# Patient Record
Sex: Female | Born: 1965
Health system: Southern US, Community
[De-identification: ages and names within clinical notes are randomized; demographics above are authoritative.]

## PROBLEM LIST (undated history)

## (undated) DIAGNOSIS — G4700 Insomnia, unspecified: Secondary | ICD-10-CM

## (undated) DIAGNOSIS — R519 Headache, unspecified: Secondary | ICD-10-CM

## (undated) DIAGNOSIS — I10 Essential (primary) hypertension: Secondary | ICD-10-CM

## (undated) DIAGNOSIS — F419 Anxiety disorder, unspecified: Secondary | ICD-10-CM

## (undated) DIAGNOSIS — J069 Acute upper respiratory infection, unspecified: Secondary | ICD-10-CM

## (undated) DIAGNOSIS — K8020 Calculus of gallbladder without cholecystitis without obstruction: Secondary | ICD-10-CM

## (undated) DIAGNOSIS — F909 Attention-deficit hyperactivity disorder, unspecified type: Secondary | ICD-10-CM

## (undated) DIAGNOSIS — T7840XA Allergy, unspecified, initial encounter: Secondary | ICD-10-CM

## (undated) DIAGNOSIS — F32A Depression, unspecified: Secondary | ICD-10-CM

## (undated) DIAGNOSIS — G43909 Migraine, unspecified, not intractable, without status migrainosus: Secondary | ICD-10-CM

## (undated) HISTORY — DX: Calculus of gallbladder without cholecystitis without obstruction: K80.20

## (undated) HISTORY — PX: COLONOSCOPY: SHX174

## (undated) HISTORY — DX: Insomnia, unspecified: G47.00

## (undated) HISTORY — PX: GREAT TOE ARTHRODESIS, METATARSALPHALANGEAL JOINT: SUR56

## (undated) HISTORY — PX: EYE SURGERY: SHX253

## (undated) HISTORY — DX: Allergy, unspecified, initial encounter: T78.40XA

## (undated) HISTORY — DX: Acute upper respiratory infection, unspecified: J06.9

## (undated) HISTORY — DX: Migraine, unspecified, not intractable, without status migrainosus: G43.909

---

## 1998-03-21 ENCOUNTER — Inpatient Hospital Stay (HOSPITAL_COMMUNITY): Admission: AD | Admit: 1998-03-21 | Discharge: 1998-03-21 | Payer: Self-pay | Admitting: Obstetrics and Gynecology

## 1998-04-11 ENCOUNTER — Inpatient Hospital Stay (HOSPITAL_COMMUNITY): Admission: AD | Admit: 1998-04-11 | Discharge: 1998-04-13 | Payer: Self-pay | Admitting: Obstetrics and Gynecology

## 1998-05-27 ENCOUNTER — Inpatient Hospital Stay (HOSPITAL_COMMUNITY): Admission: AD | Admit: 1998-05-27 | Discharge: 1998-05-27 | Payer: Self-pay | Admitting: Obstetrics and Gynecology

## 1998-06-08 ENCOUNTER — Inpatient Hospital Stay (HOSPITAL_COMMUNITY): Admission: AD | Admit: 1998-06-08 | Discharge: 1998-06-08 | Payer: Self-pay | Admitting: *Deleted

## 1998-06-09 ENCOUNTER — Inpatient Hospital Stay (HOSPITAL_COMMUNITY): Admission: AD | Admit: 1998-06-09 | Discharge: 1998-06-09 | Payer: Self-pay | Admitting: Obstetrics and Gynecology

## 1998-06-13 ENCOUNTER — Inpatient Hospital Stay (HOSPITAL_COMMUNITY): Admission: AD | Admit: 1998-06-13 | Discharge: 1998-06-15 | Payer: Self-pay | Admitting: Obstetrics and Gynecology

## 1998-07-27 ENCOUNTER — Other Ambulatory Visit: Admission: RE | Admit: 1998-07-27 | Discharge: 1998-07-27 | Payer: Self-pay | Admitting: Obstetrics and Gynecology

## 1999-08-15 ENCOUNTER — Other Ambulatory Visit: Admission: RE | Admit: 1999-08-15 | Discharge: 1999-08-15 | Payer: Self-pay | Admitting: Obstetrics and Gynecology

## 2000-08-18 ENCOUNTER — Other Ambulatory Visit: Admission: RE | Admit: 2000-08-18 | Discharge: 2000-08-18 | Payer: Self-pay | Admitting: Obstetrics and Gynecology

## 2001-04-30 ENCOUNTER — Encounter: Payer: Self-pay | Admitting: Family Medicine

## 2001-04-30 ENCOUNTER — Encounter: Admission: RE | Admit: 2001-04-30 | Discharge: 2001-04-30 | Payer: Self-pay | Admitting: Family Medicine

## 2001-09-16 ENCOUNTER — Other Ambulatory Visit: Admission: RE | Admit: 2001-09-16 | Discharge: 2001-09-16 | Payer: Self-pay | Admitting: Obstetrics and Gynecology

## 2002-09-26 ENCOUNTER — Other Ambulatory Visit: Admission: RE | Admit: 2002-09-26 | Discharge: 2002-09-26 | Payer: Self-pay | Admitting: Obstetrics and Gynecology

## 2003-03-29 ENCOUNTER — Ambulatory Visit (HOSPITAL_BASED_OUTPATIENT_CLINIC_OR_DEPARTMENT_OTHER): Admission: RE | Admit: 2003-03-29 | Discharge: 2003-03-29 | Payer: Self-pay | Admitting: Obstetrics and Gynecology

## 2003-10-06 ENCOUNTER — Other Ambulatory Visit: Admission: RE | Admit: 2003-10-06 | Discharge: 2003-10-06 | Payer: Self-pay | Admitting: Obstetrics and Gynecology

## 2004-11-07 ENCOUNTER — Other Ambulatory Visit: Admission: RE | Admit: 2004-11-07 | Discharge: 2004-11-07 | Payer: Self-pay | Admitting: Obstetrics and Gynecology

## 2005-04-07 ENCOUNTER — Other Ambulatory Visit: Admission: RE | Admit: 2005-04-07 | Discharge: 2005-04-07 | Payer: Self-pay | Admitting: Obstetrics and Gynecology

## 2005-08-04 ENCOUNTER — Other Ambulatory Visit: Admission: RE | Admit: 2005-08-04 | Discharge: 2005-08-04 | Payer: Self-pay | Admitting: Obstetrics and Gynecology

## 2006-01-26 ENCOUNTER — Other Ambulatory Visit: Admission: RE | Admit: 2006-01-26 | Discharge: 2006-01-26 | Payer: Self-pay | Admitting: Obstetrics and Gynecology

## 2006-11-02 ENCOUNTER — Encounter: Admission: RE | Admit: 2006-11-02 | Discharge: 2006-11-02 | Payer: Self-pay | Admitting: Obstetrics and Gynecology

## 2008-02-11 ENCOUNTER — Encounter: Admission: RE | Admit: 2008-02-11 | Discharge: 2008-02-11 | Payer: Self-pay | Admitting: Obstetrics and Gynecology

## 2009-02-13 ENCOUNTER — Encounter: Admission: RE | Admit: 2009-02-13 | Discharge: 2009-02-13 | Payer: Self-pay | Admitting: Obstetrics and Gynecology

## 2010-05-13 ENCOUNTER — Encounter: Admission: RE | Admit: 2010-05-13 | Discharge: 2010-05-13 | Payer: Self-pay | Admitting: Family Medicine

## 2011-09-01 ENCOUNTER — Ambulatory Visit: Payer: No Typology Code available for payment source | Attending: Family Medicine

## 2011-09-01 DIAGNOSIS — R5381 Other malaise: Secondary | ICD-10-CM | POA: Insufficient documentation

## 2011-09-01 DIAGNOSIS — IMO0001 Reserved for inherently not codable concepts without codable children: Secondary | ICD-10-CM | POA: Insufficient documentation

## 2011-09-01 DIAGNOSIS — M25659 Stiffness of unspecified hip, not elsewhere classified: Secondary | ICD-10-CM | POA: Insufficient documentation

## 2011-09-01 DIAGNOSIS — M545 Low back pain, unspecified: Secondary | ICD-10-CM | POA: Insufficient documentation

## 2011-09-03 ENCOUNTER — Ambulatory Visit: Payer: No Typology Code available for payment source

## 2011-09-08 ENCOUNTER — Ambulatory Visit: Payer: No Typology Code available for payment source | Admitting: Physical Therapy

## 2011-09-09 ENCOUNTER — Encounter (HOSPITAL_BASED_OUTPATIENT_CLINIC_OR_DEPARTMENT_OTHER)
Admission: RE | Admit: 2011-09-09 | Discharge: 2011-09-09 | Disposition: A | Discharge status: Home / Self Care | Payer: BC Managed Care – PPO | Source: Ambulatory Visit | Attending: Orthopedic Surgery | Admitting: Orthopedic Surgery

## 2011-09-09 LAB — BASIC METABOLIC PANEL
BUN: 12 mg/dL (ref 6–23)
Creatinine, Ser: 0.62 mg/dL (ref 0.50–1.10)
GFR calc Af Amer: >60 mL/min (ref >60)
GFR calc non Af Amer: >60 mL/min (ref >60)

## 2011-09-10 ENCOUNTER — Ambulatory Visit: Payer: No Typology Code available for payment source | Admitting: Physical Therapy

## 2011-09-11 ENCOUNTER — Ambulatory Visit (HOSPITAL_BASED_OUTPATIENT_CLINIC_OR_DEPARTMENT_OTHER)
Admission: RE | Admit: 2011-09-11 | Discharge: 2011-09-11 | Disposition: A | Discharge status: Home / Self Care | Payer: BC Managed Care – PPO | Source: Ambulatory Visit | Attending: Orthopedic Surgery | Admitting: Orthopedic Surgery

## 2011-09-11 DIAGNOSIS — Z01810 Encounter for preprocedural cardiovascular examination: Secondary | ICD-10-CM | POA: Insufficient documentation

## 2011-09-11 DIAGNOSIS — K219 Gastro-esophageal reflux disease without esophagitis: Secondary | ICD-10-CM | POA: Insufficient documentation

## 2011-09-11 DIAGNOSIS — I10 Essential (primary) hypertension: Secondary | ICD-10-CM | POA: Insufficient documentation

## 2011-09-11 DIAGNOSIS — M2020 Hallux rigidus, unspecified foot: Secondary | ICD-10-CM | POA: Insufficient documentation

## 2011-09-11 DIAGNOSIS — Z01812 Encounter for preprocedural laboratory examination: Secondary | ICD-10-CM | POA: Insufficient documentation

## 2011-09-11 LAB — POCT HEMOGLOBIN-HEMACUE: Hemoglobin: 15.3 g/dL — ABNORMAL HIGH (ref 12.0–15.0)

## 2011-09-18 NOTE — Op Note (Addendum)
NAMEJAMILYNN, WHITACRE NO.:  [id number]  MEDICAL RECORD NO.:  [id number]  LOCATION:  [facility]                         FACILITY:  [facility]  PHYSICIAN:  Toni Arthurs, MD        DATE OF BIRTH:  07-04-1966  DATE OF PROCEDURE:  09/11/2011 DATE OF DISCHARGE:                              OPERATIVE REPORT   PREOPERATIVE DIAGNOSIS:  Left hallux rigidus.  POSTOPERATIVE DIAGNOSIS:  Left hallux rigidus.  PROCEDURE.:  Left hallux metatarsophalangeal joint cheilectomy.  SURGEON:  Toni Arthurs, MD  ANESTHESIA:  General, regional.  IV FLUIDS:  See anesthesia record.  ESTIMATED BLOOD LOSS:  Minimal.  TOURNIQUET TIME:  See anesthesia record.  COMPLICATIONS:  None apparent.  DISPOSITION:  Extubated, awake, and stable to recovery.  INDICATIONS FOR PROCEDURE:  The patient is a 45 year old woman who complains of left dorsal pain over the first MTP joint.  She has x-ray findings consistent with hallux rigidus.  She has failed nonoperative treatment including activity modification, shoe modification, and oral anti-inflammatory medications.  She presents now for operative treatment of this condition.  She understands the risks and benefits of this procedure as well as the alternative treatment options.  Specifically, she understands risks of bleeding, infection, nerve damage, blood clots, need for additional surgery, amputation and death.  She elects to proceed.  PROCEDURE IN DETAIL:  After preoperative consent was obtained, the correct operative site was identified.  The patient was brought to the operating room and placed supine on the operating table.  General anesthesia was induced.  Preoperative antibiotics were administered. Surgical time-out was taken.  The left lower extremity was then prepped and draped in standard sterile fashion.  A longitudinal incision was marked over the dorsal aspect of the first MTP joint.  The extremity was exsanguinated with a fourth inch  Esmarch bandage, and this was wrapped around the ankle as a tourniquet.  The dorsal incision was made and sharp dissection was carried through the skin and subcutaneous tissue to the level of the extensor hallucis longus tendon.  The dorsal joint capsule was incised at the EHL tendon taking care to protect the tendon. It was retracted laterally exposing the EHB tendon.  Again, the dorsal capsule was incised adjacent to the [facility] tendon and this was retracted laterally as well, significant amount of synovitis was noted immediately upon opening the joint.  This was all excised sharply leaving the capsule itself intact.  Two small loose bodies were removed.  The base of the proximal phalanx was noted to have loss of cartilage at its dorsal few millimeters.  A 0.054 K-wire was used to perforate the subchondral bone in this area.  The sagittal saw was then used to resect the large dorsal osteophyte off the metatarsal head.  The cut edges were smoothed with a rongeur.  The hallux MP joint was noted to dorsiflex approximately 90 degrees and plantar flex 75 degrees at this point.  The wound was irrigated copiously down into the MTP joint.  The dorsal joint capsule was repaired over the cut surface of the bone.  The extensor hallucis longus was noted to move freely over the repaired dorsal capsule.  The subcutaneous  tissue was then approximated with inverted simple sutures of 4-0 Monocryl.  The skin was closed with horizontal mattress sutures of 3-0 Prolene.  Sterile dressings were applied followed by a compression wrap.  The patient was then awakened by anesthesia and transported to recovery room in stable condition. Tourniquet had been released after application of the dressings.  FOLLOW-UP PLAN:  The patient will be weightbearing as tolerated on her left lower extremity in hard sole shoe.  She is encouraged to move it to her tolerance.  She will follow up with me in 2 weeks for  suture removal.     Toni Arthurs, MD     JH/MEDQ  D:  09/11/2011  T:  09/11/2011  Job:  [id number]  Electronically Signed by Jonny Ruiz Marranda Arakelian  on 09/24/2011 12:32:06 PM

## 2011-09-22 ENCOUNTER — Encounter: Payer: No Typology Code available for payment source | Admitting: Physical Therapy

## 2011-09-25 ENCOUNTER — Ambulatory Visit: Payer: No Typology Code available for payment source | Attending: Family Medicine

## 2011-09-25 DIAGNOSIS — M545 Low back pain, unspecified: Secondary | ICD-10-CM | POA: Insufficient documentation

## 2011-09-25 DIAGNOSIS — IMO0001 Reserved for inherently not codable concepts without codable children: Secondary | ICD-10-CM | POA: Insufficient documentation

## 2011-09-25 DIAGNOSIS — R5381 Other malaise: Secondary | ICD-10-CM | POA: Insufficient documentation

## 2011-09-25 DIAGNOSIS — M25659 Stiffness of unspecified hip, not elsewhere classified: Secondary | ICD-10-CM | POA: Insufficient documentation

## 2011-10-06 ENCOUNTER — Ambulatory Visit: Payer: No Typology Code available for payment source

## 2013-10-12 ENCOUNTER — Other Ambulatory Visit: Payer: Self-pay | Admitting: Family Medicine

## 2013-10-12 ENCOUNTER — Ambulatory Visit
Admission: RE | Admit: 2013-10-12 | Discharge: 2013-10-12 | Disposition: A | Discharge status: Home / Self Care | Payer: BC Managed Care – PPO | Source: Ambulatory Visit | Attending: Family Medicine | Admitting: Family Medicine

## 2013-10-12 DIAGNOSIS — M5431 Sciatica, right side: Secondary | ICD-10-CM

## 2014-01-31 ENCOUNTER — Encounter: Payer: Self-pay | Admitting: Family Medicine

## 2014-01-31 ENCOUNTER — Ambulatory Visit (INDEPENDENT_AMBULATORY_CARE_PROVIDER_SITE_OTHER): Payer: BC Managed Care – PPO | Admitting: Family Medicine

## 2014-01-31 VITALS — BP 110/62 | HR 72 | Temp 97.7°F | Resp 16 | Ht <= 58 in | Wt 123.0 lb

## 2014-01-31 DIAGNOSIS — M999 Biomechanical lesion, unspecified: Secondary | ICD-10-CM

## 2014-01-31 DIAGNOSIS — M9981 Other biomechanical lesions of cervical region: Secondary | ICD-10-CM

## 2014-01-31 DIAGNOSIS — M62838 Other muscle spasm: Secondary | ICD-10-CM

## 2014-01-31 MED ORDER — MELOXICAM 15 MG PO TABS: 15.0000 mg | ORAL_TABLET | Freq: Every day | ORAL | Status: DC

## 2014-01-31 NOTE — Progress Notes (Signed)
  Tawana ScaleZach Smith D.O. [facility] [address] [place], [place] [address] Phone: [phone] Subjective:     CC: neck spasm.   QQV:ZDGLOVFIEPHPI:Subjective Sheila AlertWendi Lou Salazar is a 48 y.o. female coming in with complaint of Left-sided neck spasm. Patient states that she's had this intermittent but unfortunately this week and she had a very bad one that continued throughout this first 2 days of week. Patient is having trouble rotating to the left. Patient states it is localized on the left side with no radiation down the arm or any numbness or weakness. Patient states that he was very severe at 9/10 but now down to 4/10. Patient states that it even hurts while she is sleeping. Patient has tried over-the-counter anti-inflammatories with mild improvement. Patient was told by a friend that osteopathic manipulation may be helpful.     Past medical history, social, surgical and family history all reviewed in electronic medical record.   Review of Systems: No headache, visual changes, nausea, vomiting, diarrhea, constipation, dizziness, abdominal pain, skin rash, fevers, chills, night sweats, weight loss, swollen lymph nodes, body aches, joint swelling, muscle aches, chest pain, shortness of breath, mood changes.   Objective Blood pressure 110/62, pulse 72, temperature 97.7 F (36.5 C), temperature source Oral, resp. rate 16, weight 123 lb (55.792 kg), SpO2 98.00%.  General: No apparent distress Salazar and oriented x3 mood and affect normal, dressed appropriately.  HEENT: Pupils equal, extraocular movements intact  Respiratory: Patient's speak in full sentences and does not appear short of breath  Cardiovascular: No lower extremity edema, non tender, no erythema  Skin: Warm dry intact with no signs of infection or rash on extremities or on axial skeleton.  Abdomen: Soft nontender  Neuro: Cranial nerves II through XII are intact, neurovascularly intact in all extremities with 2+ DTRs and 2+ pulses.  Lymph:  No lymphadenopathy of posterior or anterior cervical chain or axillae bilaterally.  Gait normal with good balance and coordination.  MSK:  Non tender with full range of motion and good stability and symmetric strength and tone of shoulders, elbows, wrist, hip, knee and ankles bilaterally.  Neck: Inspection unremarkable. No palpable stepoffs. Negative Spurling's maneuver. Patient does have limited left rotation and left side bending otherwise full range of motion. Grip strength and sensation normal in bilateral hands Strength good C4 to T1 distribution No sensory change to C4 to T1 Negative Hoffman sign bilaterally Reflexes normal  OMT Physical Exam  Standing structural       Occiput left higher  Shoulder left higher.     Standing flexion   Seated Flexion  Cervical  C2 flexed rotated and side bent left C4 flexed rotated inside that right  Thoracic T3 extended rotated and side bent left  Lumbar L2 flexed rotated inside the right  Sacrum Neutral Illium       Impression and Recommendations:     This case required medical decision making of moderate complexity.

## 2014-01-31 NOTE — Assessment & Plan Note (Signed)

## 2014-01-31 NOTE — Progress Notes (Signed)
Pre-visit discussion using our clinic review tool. No additional management support is needed unless otherwise documented below in the visit note.  

## 2014-01-31 NOTE — Assessment & Plan Note (Signed)
Patient responded very well to osteopathic manipulation. Medications per orders Home exercise program given and discussed icing protocol Patient will also try over-the-counter medicines that could be beneficial. Patient will come back again in 2-3 weeks for further manipulation.

## 2014-01-31 NOTE — Assessment & Plan Note (Signed)
Decision today to treat with OMT was based on Physical Exam  After verbal consent patient was treated with HVLA, ME, FPR techniques in cervical, thoracic, lumbar areas  Patient tolerated the procedure well with improvement in symptoms  Patient given exercises, stretches and lifestyle modifications  See medications in patient instructions if given  Patient will follow up in 2-3 weeks 

## 2014-01-31 NOTE — Patient Instructions (Signed)
Good to meet you Ice for first 48 hours then heat if have a spasm.  For your neck. Try exercises 3 times a week for next 2 weeks  Posture exercises. Stand on wall with heels, butt, shoulders and head daily for goal of 5 minutes.  Try to strengthen hip abductors on side, lift 6 inches, hold 2 second, down slow for count of 4 repeat 30 reps each side daily.  Vitamin D 2000 IU daily  Turmeric 500mg  twice daily.  Come back again in 2 weeks.

## 2014-02-14 ENCOUNTER — Ambulatory Visit: Payer: BC Managed Care – PPO | Admitting: Family Medicine

## 2014-02-22 ENCOUNTER — Ambulatory Visit (INDEPENDENT_AMBULATORY_CARE_PROVIDER_SITE_OTHER): Payer: BC Managed Care – PPO | Admitting: Family Medicine

## 2014-02-22 ENCOUNTER — Encounter: Payer: Self-pay | Admitting: Family Medicine

## 2014-02-22 VITALS — BP 110/62 | HR 87 | Temp 98.0°F | Resp 16 | Wt 120.2 lb

## 2014-02-22 DIAGNOSIS — M62838 Other muscle spasm: Secondary | ICD-10-CM

## 2014-02-22 DIAGNOSIS — M6240 Contracture of muscle, unspecified site: Secondary | ICD-10-CM

## 2014-02-22 DIAGNOSIS — M9981 Other biomechanical lesions of cervical region: Secondary | ICD-10-CM

## 2014-02-22 DIAGNOSIS — M24559 Contracture, unspecified hip: Secondary | ICD-10-CM | POA: Insufficient documentation

## 2014-02-22 DIAGNOSIS — M999 Biomechanical lesion, unspecified: Secondary | ICD-10-CM

## 2014-02-22 NOTE — Assessment & Plan Note (Signed)
Decision today to treat with OMT was based on Physical Exam  After verbal consent patient was treated with HVLA, ME, FPR techniques in cervical, thoracic, lumbar  areas  Patient tolerated the procedure well with improvement in symptoms  Patient given exercises, stretches and lifestyle modifications  See medications in patient instructions if given  Patient will follow up in 4 weeks 

## 2014-02-22 NOTE — Assessment & Plan Note (Signed)
Decision today to treat with OMT was based on Physical Exam  After verbal consent patient was treated with HVLA, ME, FPR techniques in cervical, thoracic, lumbar areas  Patient tolerated the procedure well with improvement in symptoms  Patient given exercises, stretches and lifestyle modifications  See medications in patient instructions if given  Patient will follow up in 4 weeksDecision today to treat with OMT was based on Physical Exam  After verbal consent patient was treated with HVLA, ME, FPR techniques in cervical, thoracic, lumbar areas  Patient tolerated the procedure well with improvement in symptoms  Patient given exercises, stretches and lifestyle modifications  See medications in patient instructions if given  Patient will follow up in 4 weeks

## 2014-02-22 NOTE — Patient Instructions (Signed)
Good to see you! You are doing great.  Try exercises now 3 times a week Hip flexor stretch I am adding.  Do home exercise for your TMJ down then over.  Come back again 4 weeks.

## 2014-02-22 NOTE — Progress Notes (Signed)
Pre visit review using our clinic review tool, if applicable. No additional management support is needed unless otherwise documented below in the visit note. 

## 2014-02-22 NOTE — Assessment & Plan Note (Signed)
This is likely secondary to patient's hip abductor weakness. Patient is strengthening but still somewhat weak. Patient given a home exercises working on stretching the area. Patient will come back again in 4 weeks for further evaluation.

## 2014-02-22 NOTE — Assessment & Plan Note (Signed)
Patient continues to do relatively well. Encourage her to continue the posture exercises at least 3 times a week. Given some new exercises working on hip flexors well. Patient and will come back again in 4 weeks for further evaluation.

## 2014-02-22 NOTE — Progress Notes (Signed)
  Sheila Salazar D.O.  [facility] [address] [place], [place] [address] Phone: [phone] Subjective:     CC: neck spasm. Followup  FAO:ZHYQMVHQIOHPI:Subjective Sheila DupesWendi Salazar is a 48 y.o. female coming in with complaint of Left-sided neck spasm. Patient was seen previously and has been working on posture as well as back exercises. Patient did respond very well to osteopathic manipulation. Patient states she has approximately 40-60% better. Patient still has some mild discomfort over the course last week of low back pain but otherwise her neck has been feeling much better.    Past medical history, social, surgical and family history all reviewed in electronic medical record.   Review of Systems: No headache, visual changes, nausea, vomiting, diarrhea, constipation, dizziness, abdominal pain, skin rash, fevers, chills, night sweats, weight loss, swollen lymph nodes, body aches, joint swelling, muscle aches, chest pain, shortness of breath, mood changes.   Objective Blood pressure 110/62, pulse 87, temperature 98 F (36.7 C), temperature source Oral, resp. rate 16, weight 120 lb 3.2 oz (54.522 kg), SpO2 98.00%.  General: No apparent distress alert and oriented x3 mood and affect normal, dressed appropriately.  HEENT: Pupils equal, extraocular movements intact  Respiratory: Patient's speak in full sentences and does not appear short of breath  Cardiovascular: No lower extremity edema, non tender, no erythema  Skin: Warm dry intact with no signs of infection or rash on extremities or on axial skeleton.  Abdomen: Soft nontender  Neuro: Cranial nerves II through XII are intact, neurovascularly intact in all extremities with 2+ DTRs and 2+ pulses.  Lymph: No lymphadenopathy of posterior or anterior cervical chain or axillae bilaterally.  Gait normal with good balance and coordination.  MSK:  Non tender with full range of motion and good stability and symmetric strength and tone of shoulders,  elbows, wrist, hip, knee and ankles bilaterally.  Neck: Inspection unremarkable. No palpable stepoffs. Negative Spurling's maneuver. Patient does have limited left rotation and left side bending otherwise full range of motion. Grip strength and sensation normal in bilateral hands Strength good C4 to T1 distribution No sensory change to C4 to T1 Negative Hoffman sign bilaterally Reflexes normal  OMT Physical Exam  Standing structural    Cervical  C2 flexed rotated and side bent left C4 flexed rotated inside that right  Thoracic T3 extended rotated and side bent left  Lumbar L2 flexed rotated inside the right  Sacrum Right on right Illium       Impression and Recommendations:     This case required medical decision making of moderate complexity.

## 2014-03-31 ENCOUNTER — Ambulatory Visit: Payer: BC Managed Care – PPO | Admitting: Family Medicine

## 2014-04-04 ENCOUNTER — Encounter: Payer: Self-pay | Admitting: Family Medicine

## 2014-04-04 ENCOUNTER — Ambulatory Visit (INDEPENDENT_AMBULATORY_CARE_PROVIDER_SITE_OTHER): Payer: BC Managed Care – PPO | Admitting: Family Medicine

## 2014-04-04 VITALS — BP 110/72 | HR 83

## 2014-04-04 DIAGNOSIS — R1031 Right lower quadrant pain: Secondary | ICD-10-CM

## 2014-04-04 DIAGNOSIS — G8929 Other chronic pain: Secondary | ICD-10-CM

## 2014-04-04 NOTE — Assessment & Plan Note (Signed)
Patient does have some right lower quadrant pain. Patient does have involuntary guarding of this right lower quadrant. Patient though does seem to be fairly comfortable for any potential appendicitis. Discussed with patient at this time about different possible treatment options but would patient having decreased appetite as well as she stating that the pain seems to be worsening while we discussed we did decide to do further imaging studies including a CT scan of the abdomen. I would like to rule out an appendicitis. Patient also has a past medical history significant for ovarian cysts. Patient does have intrauterine device and states that there is no chance of being pregnant. Patient will get this test depending on what we find and we can discuss further detail about treatment options. It is an appendicitis patient will likely be having surgery short of an ovarian cyst we will monitor closely. The patient is having referred pain from her back and we'll consider further imaging including x-ray of potential MRI but I do not think that this is the case with a negative straight leg test. We did not do osteopathic manipulation today and she will discuss again in followup in 1-2 weeks.  Spent greater than 25 minutes with patient face-to-face and had greater than 50% of counseling including as described above in assessment and plan.

## 2014-04-04 NOTE — Patient Instructions (Signed)
You are doing well.  CT scan ordered now. I will call you with the results.  Fever or chills, or nausea, go to ED immediately.  Diet watch no nuts popcorn for now.  Come back in 2 weeks.

## 2014-04-04 NOTE — Progress Notes (Signed)
  Tawana ScaleZach Jashan Cotten D.O. [facility] [address] [place], [place] [address] Phone: [phone] Subjective:     CC: neck pain and low back pain.   BJY:NWGNFAOZHYHPI:Subjective Sheila AlertWendi Lou MinerHuffman is a 48 y.o. female coming in with more complaint of low back pain. Patient has had tight hip flexor for but states that it seems to be isolated mostly on the lower right side. Patient states it seems to radiate anteriorly. Patient denies any rash any fever but states that she is simply lost her appetite of recently. Patient states the pain has been intermittent but has started to increase in frequency and duration over the course last couple months. Patient states that she can have some mild radiation of his be going down anterior aspect of her thigh as well. Patient denies any significant bowel changes or any significant weight loss.  Past medical history, social, surgical and family history all reviewed in electronic medical record.   Review of Systems: No headache, visual changes, nausea, vomiting, diarrhea, constipation, dizziness, abdominal pain, skin rash, fevers, chills, night sweats, weight loss, swollen lymph nodes, body aches, joint swelling, muscle aches, chest pain, shortness of breath, mood changes.   Objective Blood pressure 110/72, pulse 83, SpO2 98.00%.  General: No apparent distress Salazar and oriented x3 mood and affect normal, dressed appropriately.  HEENT: Pupils equal, extraocular movements intact  Respiratory: Patient's speak in full sentences and does not appear short of breath  Cardiovascular: No lower extremity edema, non tender, no erythema  Skin: Warm dry intact with no signs of infection or rash on extremities or on axial skeleton.  Abdomen: Soft the patient is tender to palpation in the right lower quadrant. Patient does have involuntary guarding over McBurney's point but does not have any rebound tenderness. Neuro: Cranial nerves II through XII are intact, neurovascularly intact in all  extremities with 2+ DTRs and 2+ pulses.  Lymph: No lymphadenopathy of posterior or anterior cervical chain or axillae bilaterally.  Gait normal with good balance and coordination.  MSK:  Non tender with full range of motion and good stability and symmetric strength and tone of shoulders, elbows, wrist, hip, knee and ankles bilaterally.  Back Exam:  Inspection: Unremarkable  Motion: Flexion 35 deg, Extension 35 deg, Side Bending to 35 deg bilaterally,  Rotation to 35 deg bilaterally  SLR laying: Negative  XSLR laying: Negative  Palpable tenderness: over L4 on right and right SI joint. FABER: negative. Sensory change: Gross sensation intact to all lumbar and sacral dermatomes.  Reflexes: 2+ at both patellar tendons, 2+ at achilles tendons, Babinski's downgoing.  Strength at foot  Plantar-flexion: 5/5 Dorsi-flexion: 5/5 Eversion: 5/5 Inversion: 5/5  Leg strength  Quad: 5/5 Hamstring: 5/5 Hip flexor: 5/5 Hip abductors: 5/5  Gait unremarkable.       Impression and Recommendations:     This case required medical decision making of moderate complexity.

## 2014-04-05 ENCOUNTER — Ambulatory Visit (INDEPENDENT_AMBULATORY_CARE_PROVIDER_SITE_OTHER)
Admission: RE | Admit: 2014-04-05 | Discharge: 2014-04-05 | Disposition: A | Discharge status: Home / Self Care | Payer: BC Managed Care – PPO | Source: Ambulatory Visit | Attending: Family Medicine | Admitting: Family Medicine

## 2014-04-05 DIAGNOSIS — R1031 Right lower quadrant pain: Secondary | ICD-10-CM

## 2014-04-05 MED ORDER — IOHEXOL 300 MG/ML  SOLN
100.0000 mL | Freq: Once | INTRAMUSCULAR | Status: AC | PRN
  Administered 2014-04-05: 100 mL via INTRAVENOUS

## 2014-05-22 ENCOUNTER — Encounter: Payer: Self-pay | Admitting: Family Medicine

## 2014-05-22 ENCOUNTER — Ambulatory Visit (INDEPENDENT_AMBULATORY_CARE_PROVIDER_SITE_OTHER): Payer: BC Managed Care – PPO | Admitting: Family Medicine

## 2014-05-22 VITALS — BP 120/72 | HR 78 | Ht 60.0 in | Wt 119.0 lb

## 2014-05-22 DIAGNOSIS — M6240 Contracture of muscle, unspecified site: Secondary | ICD-10-CM

## 2014-05-22 DIAGNOSIS — M999 Biomechanical lesion, unspecified: Secondary | ICD-10-CM

## 2014-05-22 DIAGNOSIS — M9981 Other biomechanical lesions of cervical region: Secondary | ICD-10-CM

## 2014-05-22 DIAGNOSIS — M24559 Contracture, unspecified hip: Secondary | ICD-10-CM

## 2014-05-22 NOTE — Assessment & Plan Note (Signed)
Decision today to treat with OMT was based on Physical Exam  After verbal consent patient was treated with HVLA and ME techniques in cervical, thoracic, lumbar and sacral areas  Patient tolerated the procedure well with improvement in symptoms  Patient given exercises, stretches and lifestyle modifications  See medications in patient instructions if given  Patient will follow up in 3-4 weeks

## 2014-05-22 NOTE — Progress Notes (Signed)
  [name] [address] [place], [place] [address] Phone: [phone] Subjective:     CC: neck pain and low back pain.   VQM:[id number] Sheila Salazar is a 48 y.o. female coming in with more complaint of low back pain.  Patient has been seen previously and will has responded well to osteopathic manipulation in the past. Patient has not been doing the exercises on a regular basis. Patient knows that when she does the exercises she usually does well when she does not do them on a regular basis she has more pain. Patient states though that overall she is still able to do all activities daily living. Patient has had an increasing amount of stress her recently.   Past medical history, social, surgical and family history all reviewed in electronic medical record.   Review of Systems: No headache, visual changes, nausea, vomiting, diarrhea, constipation, dizziness, abdominal pain, skin rash, fevers, chills, night sweats, weight loss, swollen lymph nodes, body aches, joint swelling, muscle aches, chest pain, shortness of breath, mood changes.   Objective Blood pressure 120/72, pulse 78, height 5' (1.524 m), weight 119 lb (53.978 kg), SpO2 98.00%.  General: No apparent distress alert and oriented x3 mood and affect normal, dressed appropriately.  HEENT: Pupils equal, extraocular movements intact  Respiratory: Patient's speak in full sentences and does not appear short of breath  Cardiovascular: No lower extremity edema, non tender, no erythema  Skin: Warm dry intact with no signs of infection or rash on extremities or on axial skeleton.  Abdomen: nontender.  Lymph: No lymphadenopathy of posterior or anterior cervical chain or axillae bilaterally.  Gait normal with good balance and coordination.  MSK:  Non tender with full range of motion and good stability and symmetric strength and tone of shoulders, elbows, wrist, hip, knee and ankles bilaterally.  Back Exam:    Inspection: Unremarkable  Motion: Flexion 35 deg, Extension 35 deg, Side Bending to 35 deg bilaterally,  Rotation to 35 deg bilaterally  SLR laying: Negative  XSLR laying: Negative  Palpable tenderness: over L4 on right and right SI joint. FABER: negative. Sensory change: Gross sensation intact to all lumbar and sacral dermatomes.  Reflexes: 2+ at both patellar tendons, 2+ at achilles tendons, Babinski's downgoing.  Strength at foot  Plantar-flexion: 5/5 Dorsi-flexion: 5/5 Eversion: 5/5 Inversion: 5/5  Leg strength  Quad: 5/5 Hamstring: 5/5 Hip flexor: 5/5 Hip abductors: 5/5  Gait unremarkable.   OMT Physical Exam    Cervical  C2 flexed rotated and side bent left  C4 flexed rotated inside that right  Thoracic  T3 extended rotated and side bent left  T7 extended rotated inside that right Lumbar  L2 flexed rotated inside the right  Sacrum  Right on right      Impression and Recommendations:     This case required medical decision making of moderate complexity.

## 2014-05-22 NOTE — Assessment & Plan Note (Signed)
Patient continues to have tightness which is causing rotation of her lumbar spine. Patient encouraged to do the exercises on a regular basis. We discussed other exercises to work with her posture that could be beneficial and try to limit the amount of strain on her hip flexors. We discussed shoe choices which patient laughed at. Patient will remain active. Patient will come back again in 3-4 weeks.  Spent greater than 25 minutes with patient face-to-face and had greater than 50% of counseling including as described above in assessment and plan.

## 2014-05-22 NOTE — Patient Instructions (Signed)
Good to see you.  I am glad you did not have appendicitis.  Y-T-A exercises 2 second holds repeat 10 times.  Come back in 4 weeks.

## 2014-07-28 ENCOUNTER — Ambulatory Visit: Payer: BC Managed Care – PPO | Admitting: Family Medicine

## 2014-07-31 ENCOUNTER — Ambulatory Visit: Payer: BC Managed Care – PPO | Admitting: Family Medicine

## 2014-08-08 ENCOUNTER — Ambulatory Visit (INDEPENDENT_AMBULATORY_CARE_PROVIDER_SITE_OTHER): Payer: BC Managed Care – PPO | Admitting: Family Medicine

## 2014-08-08 ENCOUNTER — Encounter: Payer: Self-pay | Admitting: Family Medicine

## 2014-08-08 VITALS — BP 124/78 | HR 85 | Ht 60.0 in | Wt 121.0 lb

## 2014-08-08 DIAGNOSIS — M9981 Other biomechanical lesions of cervical region: Secondary | ICD-10-CM

## 2014-08-08 DIAGNOSIS — M62838 Other muscle spasm: Secondary | ICD-10-CM

## 2014-08-08 DIAGNOSIS — M999 Biomechanical lesion, unspecified: Secondary | ICD-10-CM

## 2014-08-08 NOTE — Assessment & Plan Note (Signed)
Patient continues to have excessive amount of stress overall. States that overall she is doing relatively well. I do believe the patient needs to work on some more medication and was given some different exercises that I think will be beneficial. We are going to incorporate hip abductor strengthening to try to help with the core which will help with her alignment. Discuss continuing the over-the-counter medications and regular. As well as a home exercises. Patient will try these interventions and was shown proper technique. Patient will then come back and see me again in 3-4 weeks.   Spent greater than 25 minutes with patient face-to-face and had greater than 50% of counseling including as described above in assessment and plan.

## 2014-08-08 NOTE — Progress Notes (Signed)
  Tawana ScaleZach Charde Macfarlane D.O. [facility] [address] [place], [place] [address] Phone: [phone]([phone] Subjective:     CC: neck pain and low back pain.   EXB:[id number]:Subjective Ursula AlertWendi Lou MinerHuffman is a 48 y.o. female coming in with more complaint of low back pain.  Patient has been seen previously and will has responded well to osteopathic manipulation in the past.  Patient has been having some increased stress of late. Patient continues to attempt to move overall but has not found a new house. Patient's daughter is moving to college and this is causing anxiety as well. Patient states that this is causing some worsening back pain. Patient is in need of new mattress she states. Denies any radiation into her legs any numbness. Denies any nighttime awakening. Not taking any over-the-counter medications.  Past medical history, social, surgical and family history all reviewed in electronic medical record.   Review of Systems: No headache, visual changes, nausea, vomiting, diarrhea, constipation, dizziness, abdominal pain, skin rash, fevers, chills, night sweats, weight loss, swollen lymph nodes, body aches, joint swelling, muscle aches, chest pain, shortness of breath, mood changes.   Objective Blood pressure 124/78, pulse 85, height 5' (1.524 m), weight 121 lb (54.885 kg), SpO2 99.00%.  General: No apparent distress alert and oriented x3 mood and affect normal, dressed appropriately.  HEENT: Pupils equal, extraocular movements intact  Respiratory: Patient's speak in full sentences and does not appear short of breath  Cardiovascular: No lower extremity edema, non tender, no erythema  Skin: Warm dry intact with no signs of infection or rash on extremities or on axial skeleton.  Abdomen: nontender.  Lymph: No lymphadenopathy of posterior or anterior cervical chain or axillae bilaterally.  Gait normal with good balance and coordination.  MSK:  Non tender with full range of motion and good stability and symmetric  strength and tone of shoulders, elbows, wrist, hip, knee and ankles bilaterally.  Back Exam:  Inspection: Unremarkable  Motion: Flexion 35 deg, Extension 35 deg, Side Bending to 35 deg bilaterally,  Rotation to 35 deg bilaterally  SLR laying: Negative  XSLR laying: Negative  Palpable tenderness: over L4 on right and right SI joint. About the same as previous exam. FABER: negative. Sensory change: Gross sensation intact to all lumbar and sacral dermatomes.  Reflexes: 2+ at both patellar tendons, 2+ at achilles tendons, Babinski's downgoing.  Strength at foot  Plantar-flexion: 5/5 Dorsi-flexion: 5/5 Eversion: 5/5 Inversion: 5/5  Leg strength  Quad: 5/5 Hamstring: 5/5 Hip flexor: 5/5 Hip abductors: 4/5  Gait unremarkable.   OMT Physical Exam    Cervical  C2 flexed rotated and side bent left  C4 flexed rotated inside that right  Thoracic  T3 extended rotated and side bent left  T7 extended rotated inside that right Lumbar  L2 flexed rotated inside the right  Sacrum  Right on right      Impression and Recommendations:     This case required medical decision making of moderate complexity.

## 2014-08-08 NOTE — Assessment & Plan Note (Signed)
Decision today to treat with OMT was based on Physical Exam  After verbal consent patient was treated with HVLA and ME techniques in cervical, thoracic, lumbar and sacral areas  Patient tolerated the procedure well with improvement in symptoms  Patient given exercises, stretches and lifestyle modifications  See medications in patient instructions if given  Patient will follow up in 3-4 weeks       

## 2014-08-08 NOTE — Patient Instructions (Signed)
Good to see you You are doing great overall.  Exercises on wall.  Heel and butt touching.  Raise leg 6 inches and hold 2 seconds.  Down slow for count of 4 seconds.  1 set of 30 reps daily on both sides.

## 2014-08-16 ENCOUNTER — Encounter: Payer: Self-pay | Admitting: Family Medicine

## 2014-08-25 ENCOUNTER — Encounter: Payer: Self-pay | Admitting: Family Medicine

## 2014-08-25 ENCOUNTER — Ambulatory Visit (INDEPENDENT_AMBULATORY_CARE_PROVIDER_SITE_OTHER): Payer: BC Managed Care – PPO | Admitting: Family Medicine

## 2014-08-25 VITALS — BP 114/80 | HR 65 | Ht 60.0 in | Wt 121.0 lb

## 2014-08-25 DIAGNOSIS — M999 Biomechanical lesion, unspecified: Secondary | ICD-10-CM

## 2014-08-25 DIAGNOSIS — M9981 Other biomechanical lesions of cervical region: Secondary | ICD-10-CM

## 2014-08-25 DIAGNOSIS — M533 Sacrococcygeal disorders, not elsewhere classified: Secondary | ICD-10-CM

## 2014-08-25 MED ORDER — DICLOFENAC SODIUM 2 % TD SOLN: 2.0000 "application " | Freq: Two times a day (BID) | TRANSDERMAL | Status: DC

## 2014-08-25 NOTE — Assessment & Plan Note (Signed)
Decision today to treat with OMT was based on Physical Exam  After verbal consent patient was treated with HVLA and ME techniques in cervical, thoracic, lumbar and sacral areas  Patient tolerated the procedure well with improvement in symptoms  Patient given exercises, stretches and lifestyle modifications  See medications in patient instructions if given  Patient will follow up in 2 weeks

## 2014-08-25 NOTE — Patient Instructions (Addendum)
Always good to see you.  You are doing great! Conitnue the exercises.  Love the posture! Come back in 2 weeks. If hurts we will do an injection.
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
## 2014-08-25 NOTE — Assessment & Plan Note (Signed)
Patient will continue with home exercises and working on hip abductor strengthening. We discussed about different stretching they can be beneficial as well. Patient was given a topical anti-inflammatory prescription I think would be helpful. We discussed icing. Patient and will come back again in 2 weeks. If she continues to have discomfort likely do an intra-articular injection into his right SI joint.

## 2014-08-29 ENCOUNTER — Ambulatory Visit: Payer: BC Managed Care – PPO | Admitting: Family Medicine

## 2014-09-08 ENCOUNTER — Ambulatory Visit (INDEPENDENT_AMBULATORY_CARE_PROVIDER_SITE_OTHER): Payer: BC Managed Care – PPO | Admitting: Family Medicine

## 2014-09-08 ENCOUNTER — Encounter: Payer: Self-pay | Admitting: Family Medicine

## 2014-09-08 VITALS — BP 116/70 | HR 101 | Ht 60.0 in | Wt 121.0 lb

## 2014-09-08 DIAGNOSIS — M533 Sacrococcygeal disorders, not elsewhere classified: Secondary | ICD-10-CM
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
## 2014-12-11 ENCOUNTER — Ambulatory Visit (INDEPENDENT_AMBULATORY_CARE_PROVIDER_SITE_OTHER): Payer: BC Managed Care – PPO | Admitting: Family Medicine

## 2014-12-11 ENCOUNTER — Encounter: Payer: Self-pay | Admitting: Family Medicine

## 2014-12-11 VITALS — BP 120/80 | HR 73 | Ht 60.0 in | Wt 121.0 lb

## 2014-12-11 DIAGNOSIS — M9902 Segmental and somatic dysfunction of thoracic region: Secondary | ICD-10-CM

## 2014-12-11 DIAGNOSIS — M9903 Segmental and somatic dysfunction of lumbar region: Secondary | ICD-10-CM

## 2014-12-11 DIAGNOSIS — M999 Biomechanical lesion, unspecified: Secondary | ICD-10-CM

## 2014-12-11 DIAGNOSIS — M9901 Segmental and somatic dysfunction of cervical region: Secondary | ICD-10-CM

## 2014-12-11 DIAGNOSIS — M533 Sacrococcygeal disorders, not elsewhere classified: Secondary | ICD-10-CM

## 2014-12-11 NOTE — Assessment & Plan Note (Signed)
Patient has been doing very well for the last 3 months. Patient did have an injection previously. Patient did have manipulation today with fairly good resolution of pain. Patient will continue the topical anti-inflammatories but does have a little bit of a allergy. We discussed icing regimen. We discussed home exercises. Patient will then come back and see me again in 3 weeks for further evaluation and treatment.  Spent greater than 25 minutes with patient face-to-face and had greater than 50% of counseling including as described above in assessment and plan.

## 2014-12-11 NOTE — Assessment & Plan Note (Signed)
Decision today to treat with OMT was based on Physical Exam  After verbal consent patient was treated with HVLA and ME techniques in cervical, thoracic, lumbar and sacral areas  Patient tolerated the procedure well with improvement in symptoms  Patient given exercises, stretches and lifestyle modifications  See medications in patient instructions if given  Patient will follow up in 3-4 weeks

## 2014-12-11 NOTE — Patient Instructions (Signed)
Good to see you Sheila BryantCe is your friend still Continue the exercises and barre class.  Try new exercise when hurting.  See me again when you need me.
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
## 2015-02-13 ENCOUNTER — Ambulatory Visit (INDEPENDENT_AMBULATORY_CARE_PROVIDER_SITE_OTHER): Payer: BLUE CROSS/BLUE SHIELD | Admitting: Family Medicine

## 2015-02-13 ENCOUNTER — Encounter: Payer: Self-pay | Admitting: Family Medicine

## 2015-02-13 ENCOUNTER — Ambulatory Visit (INDEPENDENT_AMBULATORY_CARE_PROVIDER_SITE_OTHER)
Admission: RE | Admit: 2015-02-13 | Discharge: 2015-02-13 | Disposition: A | Discharge status: Home / Self Care | Payer: BLUE CROSS/BLUE SHIELD | Source: Ambulatory Visit | Attending: Family Medicine | Admitting: Family Medicine

## 2015-02-13 VITALS — BP 114/72 | HR 76 | Ht 60.0 in | Wt 122.0 lb

## 2015-02-13 DIAGNOSIS — M999 Biomechanical lesion, unspecified: Secondary | ICD-10-CM

## 2015-02-13 DIAGNOSIS — M5416 Radiculopathy, lumbar region: Secondary | ICD-10-CM

## 2015-02-13 DIAGNOSIS — M9901 Segmental and somatic dysfunction of cervical region: Secondary | ICD-10-CM

## 2015-02-13 DIAGNOSIS — M533 Sacrococcygeal disorders, not elsewhere classified: Secondary | ICD-10-CM

## 2015-02-13 DIAGNOSIS — M9902 Segmental and somatic dysfunction of thoracic region: Secondary | ICD-10-CM

## 2015-02-13 DIAGNOSIS — M9903 Segmental and somatic dysfunction of lumbar region: Secondary | ICD-10-CM

## 2015-02-13 NOTE — Progress Notes (Signed)
Pre visit review using our clinic review tool, if applicable. No additional management support is needed unless otherwise documented below in the visit note. 

## 2015-02-13 NOTE — Patient Instructions (Signed)
Good to see you Ice is your friend when you need it Tennisball in back right pocket with sitting long time.  Turmeric 500mg  twice daily Continue to do the stretches Get xray of your back today in case we need MRI See me again in 3-4 weeks.

## 2015-02-13 NOTE — Assessment & Plan Note (Signed)
Patient continues to have some difficulty with the sacroiliac joint. Patient has had an injection in the joint previously with some minimal benefit. Patient is still activity and can do daily to these. We discussed icing regimen and home exercises. Patient did work with Event organiserathletic trainer today to learn home exercises in greater detail. X-rays ordered today because we have a 18 months with no significant improvement. Patient continues to have the radicular symptoms we may need to consider further advanced imaging such as an MRI of the lumbar spine. I'm hoping that this is not necessary. Patient come back in 3-4 weeks for further evaluation and treatment.

## 2015-02-13 NOTE — Progress Notes (Signed)
  Tawana ScaleZach Dasiah Hooley D.O. [facility] [address] [place], [place] [address] Phone: [phone] Subjective:     CC: Chronic low back pain and sacroiliac joint dysfunction follow-up  GEX:[id number]:Subjective Sheila DupesWendi Salazar is a 49 y.o. female coming in with more complaint of low back pain.  Patient does have sacroiliac joint dysfunction. Patient is having a flare. Patient has not been seen for quite some time. Patient states that she continues to have more of a radicular symptom. Patient states that the pain still seems to be more on the lower abdominal quadrant and seems to radiate towards her back. Patient does have tightness in the hip and does do a lot of exercises for this. Patient states that she always notices she is tender on the right side than the left. States that the radicular symptoms is down the posterior aspect the leg down to her calf. Patient denies any weakness.  Past medical history, social, surgical and family history all reviewed in electronic medical record.   Review of Systems: No headache, visual changes, nausea, vomiting, diarrhea, constipation, dizziness, abdominal pain, skin rash, fevers, chills, night sweats, weight loss, swollen lymph nodes, body aches, joint swelling, muscle aches, chest pain, shortness of breath, mood changes.   Objective Blood pressure 114/72, pulse 76, height 5' (1.524 m), weight 122 lb (55.339 kg), SpO2 94 %.  General: No apparent distress alert and oriented x3 mood and affect normal, dressed appropriately.  HEENT: Pupils equal, extraocular movements intact  Respiratory: Patient's speak in full sentences and does not appear short of breath  Cardiovascular: No lower extremity edema, non tender, no erythema  Skin: Warm dry intact with no signs of infection or rash on extremities or on axial skeleton.  Abdomen: nontender.  Lymph: No lymphadenopathy of posterior or anterior cervical chain or axillae bilaterally.  Gait normal with good balance and  coordination.  MSK:  Non tender with full range of motion and good stability and symmetric strength and tone of shoulders, elbows, wrist, hip, knee and ankles bilaterally.  Back Exam:  Inspection: Unremarkable  Motion: Flexion 35 deg, Extension 35 deg, Side Bending to 35 deg bilaterally,  Rotation to 35 deg bilaterally  SLR laying: Negative  XSLR laying: Negative  Palpable tenderness: Minimal tender over the SI joint tenderness to palpation over the paraspinal musculature as well. FABER: Right. Sensory change: Gross sensation intact to all lumbar and sacral dermatomes.  Reflexes: 2+ at both patellar tendons, 2+ at achilles tendons, Babinski's downgoing.  Strength at foot  Plantar-flexion: 5/5 Dorsi-flexion: 5/5 Eversion: 5/5 Inversion: 5/5  Leg strength  Quad: 5/5 Hamstring: 5/5 Hip flexor: 5/5 Hip abductors: 4/5  Gait unremarkable.   OMT Physical Exam    Cervical  C2 flexed rotated and side bent left  C4 flexed rotated inside that right  Thoracic  T3 extended rotated and side bent left  T7 extended rotated inside that right Lumbar  L2 flexed rotated inside the left Sacrum  Right on right       Impression and Recommendations:     This case required medical decision making of moderate complexity.

## 2015-03-06 ENCOUNTER — Encounter: Payer: Self-pay | Admitting: Family Medicine

## 2015-03-06 ENCOUNTER — Ambulatory Visit (INDEPENDENT_AMBULATORY_CARE_PROVIDER_SITE_OTHER): Payer: BLUE CROSS/BLUE SHIELD | Admitting: Family Medicine

## 2015-03-06 VITALS — BP 122/70 | HR 77 | Ht 60.0 in | Wt 118.0 lb

## 2015-03-06 DIAGNOSIS — M9901 Segmental and somatic dysfunction of cervical region: Secondary | ICD-10-CM

## 2015-03-06 DIAGNOSIS — M999 Biomechanical lesion, unspecified: Secondary | ICD-10-CM

## 2015-03-06 DIAGNOSIS — M9903 Segmental and somatic dysfunction of lumbar region: Secondary | ICD-10-CM

## 2015-03-06 DIAGNOSIS — M9902 Segmental and somatic dysfunction of thoracic region: Secondary | ICD-10-CM

## 2015-03-06 DIAGNOSIS — M5416 Radiculopathy, lumbar region: Secondary | ICD-10-CM | POA: Insufficient documentation

## 2015-03-06 NOTE — Progress Notes (Signed)
  Tawana ScaleZach Conroy Goracke D.O. [facility] [address] [place], [place] [address] Phone: [phone] Subjective:     CC: Chronic low back pain and sacroiliac joint dysfunction follow-up  LKG:[id number]:Subjective Karna DupesWendi Knierim is a 49 y.o. female coming in with more complaint of low back pain.  Patient does have sacroiliac joint dysfunction. Patient was last seen 3 weeks ago and was having a flare of her back as well as radicular symptoms. Patient did have x-rays which were unremarkable for any osteophytic changes or any other bony abnormality's. Patient statesShe continues to have the low back pain on the right side. Radicular symptoms going on the right leg still is occurring. Seems to be intermittent but has significant pain that stops her from activity or wakes her up at night. Seems to be's does not ache. Patient states sometimes her leg can feel most did and then it seems to resolve on its own. Patient's vision denies any swelling. Has not made any association with anything else that sometimes feels unstable ambulating secondary to this feeling in her leg. Seems to mostly be associated with her back.  Past medical history, social, surgical and family history all reviewed in electronic medical record.   Review of Systems: No headache, visual changes, nausea, vomiting, diarrhea, constipation, dizziness, abdominal pain, skin rash, fevers, chills, night sweats, weight loss, swollen lymph nodes, body aches, joint swelling, muscle aches, chest pain, shortness of breath, mood changes.   Objective Blood pressure 122/70, pulse 77, height 5' (1.524 m), weight 118 lb (53.524 kg), SpO2 97 %.  General: No apparent distress alert and oriented x3 mood and affect normal, dressed appropriately.  HEENT: Pupils equal, extraocular movements intact  Respiratory: Patient's speak in full sentences and does not appear short of breath  Cardiovascular: No lower extremity edema, non tender, no erythema  Skin: Warm dry intact  with no signs of infection or rash on extremities or on axial skeleton.  Abdomen: nontender.  Lymph: No lymphadenopathy of posterior or anterior cervical chain or axillae bilaterally.  Gait normal with good balance and coordination.  MSK:  Non tender with full range of motion and good stability and symmetric strength and tone of shoulders, elbows, wrist, hip, knee and ankles bilaterally.  Back Exam:  Inspection: Unremarkable  Motion: Flexion 35 deg, Extension 35 deg, Side Bending to 35 deg bilaterally,  Rotation to 35 deg bilaterally  SLR laying: mild positive right side XSLR laying: Negative  Palpable tenderness: Minimal tender over the SI joint tenderness to palpation over the paraspinal musculature as well. FABER: Right. Sensory change: Gross sensation intact to all lumbar and sacral dermatomes.  Reflexes: 2+ at both patellar tendons, 2+ at achilles tendons, Babinski's downgoing.  Strength at foot  Plantar-flexion: 5/5 Dorsi-flexion: 5/5 Eversion: 5/5 Inversion: 5/5  Leg strength  Quad: 5/5 Hamstring: 5/5 Hip flexor: 5/5 Hip abductors: 4/5  Gait unremarkable.   OMT Physical Exam    Cervical  C2 flexed rotated and side bent left  C4 flexed rotated inside that right  Thoracic  T3 extended rotated and side bent left  T7 extended rotated inside that right Lumbar  L2 flexed rotated inside the left Sacrum  Right on right       Impression and Recommendations:     This case required medical decision making of moderate complexity.

## 2015-03-06 NOTE — Progress Notes (Signed)
Pre visit review using our clinic review tool, if applicable. No additional management support is needed unless otherwise documented below in the visit note. 

## 2015-03-06 NOTE — Patient Instructions (Addendum)
Good to see you Xrays look great! Lets check on price of MRI with the radicular symptoms, you do not fit any true picture I will call you with the results See me again in 3-4 weeks.

## 2015-03-06 NOTE — Assessment & Plan Note (Signed)
Concern for L5, S1 pinched nerve, mild weakness can occur from time to time, none today, does respond to OMT but short lived, patient concern and I am concern for a protruding disc that could be contributing. We discussed home exercises at this time and would other tests be involved. We will get a MRI to rule out nerve root impingement. We discussed the possibility of an EMG if this does not show any significant findings. Patient will continue with the conservative therapy at this time and will come back 1-2 days afterwards or I will call her with results and we will discuss Treatment options.

## 2015-03-19 ENCOUNTER — Encounter: Payer: Self-pay | Admitting: Family Medicine

## 2015-06-05 ENCOUNTER — Encounter: Payer: Self-pay | Admitting: Family Medicine

## 2015-06-05 ENCOUNTER — Ambulatory Visit (INDEPENDENT_AMBULATORY_CARE_PROVIDER_SITE_OTHER): Payer: BLUE CROSS/BLUE SHIELD | Admitting: Family Medicine

## 2015-06-05 VITALS — BP 104/62 | HR 70 | Ht 60.0 in | Wt 121.0 lb

## 2015-06-05 DIAGNOSIS — M9902 Segmental and somatic dysfunction of thoracic region: Secondary | ICD-10-CM | POA: Diagnosis not present

## 2015-06-05 DIAGNOSIS — M9901 Segmental and somatic dysfunction of cervical region: Secondary | ICD-10-CM | POA: Diagnosis not present

## 2015-06-05 DIAGNOSIS — M5416 Radiculopathy, lumbar region: Secondary | ICD-10-CM | POA: Diagnosis not present

## 2015-06-05 DIAGNOSIS — M9903 Segmental and somatic dysfunction of lumbar region: Secondary | ICD-10-CM | POA: Diagnosis not present

## 2015-06-05 DIAGNOSIS — M999 Biomechanical lesion, unspecified: Secondary | ICD-10-CM

## 2015-06-05 MED ORDER — KETOROLAC TROMETHAMINE 60 MG/2ML IM SOLN
60.0000 mg | Freq: Once | INTRAMUSCULAR | Status: AC
  Administered 2015-06-05: 60 mg via INTRAMUSCULAR

## 2015-06-05 NOTE — Progress Notes (Signed)
Pre visit review using our clinic review tool, if applicable. No additional management support is needed unless otherwise documented below in the visit note. 

## 2015-06-05 NOTE — Assessment & Plan Note (Signed)
Decision today to treat with OMT was based on Physical Exam  After verbal consent patient was treated with HVLA and ME techniques in cervical, thoracic, lumbar and sacral areas  Patient tolerated the procedure well with improvement in symptoms  Patient given exercises, stretches and lifestyle modifications  See medications in patient instructions if given  Patient will follow up in 3-4 weeks       

## 2015-06-05 NOTE — Patient Instructions (Signed)
Good to see you 2 tennis ball in a tube sock and put them where neck meet head and lay on them.  Continue the vitamins I hope the headache center can help and ask them about gabapentin .  Lets get the mri See me again after MRi or I will call you

## 2015-06-05 NOTE — Progress Notes (Signed)
  [name] [address] [place], [place] [address] Phone: [phone] Subjective:     CC: Chronic low back pain and sacroiliac joint dysfunction follow-up  Sheila Salazar Sheila Salazar is a 49 y.o. female coming in with more complaint of low back pain.  Patient does have sacroiliac joint dysfunction. Patient was last seen greater than 3 months ago. Patient was having such severe pain that we have gotten x-rays previously that did not show any bony abnormality. We were discussed and the possibility of an MRI but did not follow through. Patient states she remains active but continues to have chronic dull aching pain that does have radiation down the right leg. Patient has had the same radiation previously that seems to be in the L3-L4 to correspond. We discussed icing with patient is not doing regularly but patient continues the natural supplementations.  She was also noticed some more headaches as well as chronic neck pain. Patient has been going to the headache center but no significant relief at this time.  Past medical history, social, surgical and family history all reviewed in electronic medical record.   Review of Systems: No headache, visual changes, nausea, vomiting, diarrhea, constipation, dizziness, abdominal pain, skin rash, fevers, chills, night sweats, weight loss, swollen lymph nodes, body aches, joint swelling, muscle aches, chest pain, shortness of breath, mood changes.   Objective Blood pressure 104/62, pulse 70, height 5' (1.524 m), weight 121 lb (54.885 kg), SpO2 98 %.  General: No apparent distress alert and oriented x3 mood and affect normal, dressed appropriately.  HEENT: Pupils equal, extraocular movements intact  Respiratory: Patient's speak in full sentences and does not appear short of breath  Cardiovascular: No lower extremity edema, non tender, no erythema  Skin: Warm dry intact with no signs of infection or rash on extremities or on  axial skeleton.  Abdomen: nontender.  Lymph: No lymphadenopathy of posterior or anterior cervical chain or axillae bilaterally.  Gait normal with good balance and coordination.  MSK:  Non tender with full range of motion and good stability and symmetric strength and tone of shoulders, elbows, wrist, hip, knee and ankles bilaterally.  Back Exam:  Inspection: Unremarkable  Motion: Flexion 35 deg, Extension 35 deg, Side Bending to 35 deg bilaterally,  Rotation to 35 deg bilaterally  SLR laying: positive right side XSLR laying: Negative  Palpable tenderness: Minimal tender over the SI joint tenderness to palpation over the paraspinal musculature as well. FABER: Right. Sensory change: Gross sensation intact to all lumbar and sacral dermatomes.  Reflexes: 2+ at both patellar tendons, 2+ at achilles tendons, Babinski's downgoing.  Strength at foot  Plantar-flexion: 5/5 Dorsi-flexion: 5/5 Eversion: 5/5 Inversion: 5/5  Leg strength  Quad: 5/5 Hamstring: 5/5 Hip flexor: 5/5 Hip abductors: 4/5  Gait unremarkable.   OMT Physical Exam    Cervical  C2 flexed rotated and side bent left  C4 flexed rotated inside that right  Thoracic  T3 extended rotated and side bent left  T7 extended rotated inside that right Lumbar  L2 flexed rotated inside the left Sacrum  Right on right still present    Impression and Recommendations:     This case required medical decision making of moderate complexity.

## 2015-06-05 NOTE — Assessment & Plan Note (Signed)
She continues to have more of a chronic right lumbar radiculopathy. Patient's deep tendon reflexes are intact and patient has full strength. We discussed once again that advance imaging could be beneficial. This was reordered today. And I think patient will now actually follow through with that. We discussed the possibility of gabapentin the patient wants to avoid that while she is trying to work with her headaches. Patient last the headache doctor and see if this will be something that they would consider. I would consider 100 mg at night only and titrate up. Patient was given new exercises to work on. If patient's MRI show  any nerve root impingement we'll consider injections.

## 2015-07-02 ENCOUNTER — Inpatient Hospital Stay: Admission: RE | Admit: 2015-07-02 | Payer: BLUE CROSS/BLUE SHIELD | Source: Ambulatory Visit

## 2015-12-26 ENCOUNTER — Telehealth: Payer: Self-pay | Admitting: Family Medicine

## 2015-12-26 NOTE — Telephone Encounter (Signed)
Spoke to pt, she is coming in tomorrow at 930a.

## 2015-12-26 NOTE — Telephone Encounter (Signed)
Pt called in today in extreme hip pain and she can hardly walk. She was hoping to be seen today. I did explain to her that today is pretty packed and probably won't be able to get in today. Can you please give her a call or let me know if there is anything you can do.

## 2015-12-26 NOTE — Telephone Encounter (Signed)
lmovm offering pt 945 tomorrow morning.

## 2015-12-27 ENCOUNTER — Ambulatory Visit (INDEPENDENT_AMBULATORY_CARE_PROVIDER_SITE_OTHER): Payer: BLUE CROSS/BLUE SHIELD | Admitting: Family Medicine

## 2015-12-27 ENCOUNTER — Encounter: Payer: Self-pay | Admitting: Family Medicine

## 2015-12-27 VITALS — BP 104/74 | HR 88 | Ht 60.0 in | Wt 119.0 lb

## 2015-12-27 DIAGNOSIS — M533 Sacrococcygeal disorders, not elsewhere classified: Secondary | ICD-10-CM

## 2015-12-27 DIAGNOSIS — M9903 Segmental and somatic dysfunction of lumbar region: Secondary | ICD-10-CM

## 2015-12-27 DIAGNOSIS — M9902 Segmental and somatic dysfunction of thoracic region: Secondary | ICD-10-CM

## 2015-12-27 DIAGNOSIS — M9901 Segmental and somatic dysfunction of cervical region: Secondary | ICD-10-CM

## 2015-12-27 DIAGNOSIS — M24551 Contracture, right hip: Secondary | ICD-10-CM

## 2015-12-27 DIAGNOSIS — M999 Biomechanical lesion, unspecified: Secondary | ICD-10-CM

## 2015-12-27 DIAGNOSIS — M9904 Segmental and somatic dysfunction of sacral region: Secondary | ICD-10-CM

## 2015-12-27 NOTE — Assessment & Plan Note (Signed)
Decision today to treat with OMT was based on Physical Exam  After verbal consent patient was treated with HVLA and ME techniques in cervical, thoracic, lumbar and sacral areas  Patient tolerated the procedure well with improvement in symptoms  Patient given exercises, stretches and lifestyle modifications  See medications in patient instructions if given  Patient will follow up in 3-4 weeks       

## 2015-12-27 NOTE — Assessment & Plan Note (Signed)
Worsening tightness. Likely secondary to her having the repetitive lifting of the back. Patient was remain active. No radicular symptoms. Patient responded very well to osteopathic manipulation. Encourage patient to do more the exercises regularly. Patient will continue stay active. We will see her again in 3-4 weeks.

## 2015-12-27 NOTE — Assessment & Plan Note (Signed)
Likely rotated synovator to tight hip flexors. Patient respond well to manipulation.

## 2015-12-27 NOTE — Progress Notes (Signed)
Pre visit review using our clinic review tool, if applicable. No additional management support is needed unless otherwise documented below in the visit note. 

## 2015-12-27 NOTE — Progress Notes (Signed)
  Tawana ScaleZach Smith D.O. [facility] [address] [place], [place] [address] Phone: [phone] Subjective:     CC: Hip pain with history of chronic low back pain and sacroiliac joint dysfunction  ONG:[id number]:Subjective Sheila DupesWendi Salazar is a 50 y.o. female coming in with more complaint of low back pain.  Patient does have sacroiliac joint dysfunction. P patient has recently moved in doing repetitive moving of boxes seem to be significant. Patient had to do some downsizing. States that after multiple glass boxes she seemed to have her back sees up. Pain seems to radiate from the posterior aspect of her back towards her right hip. No pain down the leg. No numbness. States that improving very slowly. Has not taken any medications. Traces severity of pain now as 4 out of 10.  Past medical history, social, surgical and family history all reviewed and no pertinent info pertaining to chief complaint. All other was reviewed using the EMR.    Review of Systems: No headache, visual changes, nausea, vomiting, diarrhea, constipation, dizziness, abdominal pain, skin rash, fevers, chills, night sweats, weight loss, swollen lymph nodes, body aches, joint swelling, muscle aches, chest pain, shortness of breath, mood changes.   Objective Blood pressure 104/74, pulse 88, height 5' (1.524 m), weight 119 lb (53.978 kg), SpO2 98 %.  General: No apparent distress alert and oriented x3 mood and affect normal, dressed appropriately.  HEENT: Pupils equal, extraocular movements intact  Respiratory: Patient's speak in full sentences and does not appear short of breath  Cardiovascular: No lower extremity edema, non tender, no erythema  Skin: Warm dry intact with no signs of infection or rash on extremities or on axial skeleton.  Abdomen: nontender.  Lymph: No lymphadenopathy of posterior or anterior cervical chain or axillae bilaterally.  Gait normal with good balance and coordination.  MSK:  Non tender with full range of  motion and good stability and symmetric strength and tone of shoulders, elbows, wrist, hip, knee and ankles bilaterally.  Back Exam:  Inspection: Unremarkable  Motion: Flexion 35 deg, Extension 35 deg, Side Bending to 35 deg bilaterally,  Rotation to 35 deg bilaterally  SLR laying: Negative XSLR laying: Negative  Palpable tenderness: Given tightness of the right sided hip flexor as well as the iliac Edison some mild pain over the right sacroiliac joint FABER: Right. Sensory change: Gross sensation intact to all lumbar and sacral dermatomes.  Reflexes: 2+ at both patellar tendons, 2+ at achilles tendons, Babinski's downgoing.  Strength at foot  Plantar-flexion: 5/5 Dorsi-flexion: 5/5 Eversion: 5/5 Inversion: 5/5  Leg strength  Quad: 5/5 Hamstring: 5/5 Hip flexor: 5/5 Hip abductors: 4/5  Gait unremarkable.   OMT Physical Exam    Cervical  C2 flexed rotated and side bent left  C4 flexed rotated inside that right  Thoracic  T3 extended rotated and side bent left  T7 extended rotated inside that right Lumbar  L2 flexed rotated inside the left Sacrum  Right on right     Impression and Recommendations:     This case required medical decision making of moderate complexity.

## 2015-12-27 NOTE — Patient Instructions (Signed)
Happy New Year! Great to see you Duexis 3 times a day for 6 days Stay active Let someone else get the big boxes You know where I am if you need me.

## 2016-04-08 DIAGNOSIS — M2241 Chondromalacia patellae, right knee: Secondary | ICD-10-CM | POA: Diagnosis not present

## 2016-04-21 DIAGNOSIS — M6281 Muscle weakness (generalized): Secondary | ICD-10-CM | POA: Diagnosis not present

## 2016-04-21 DIAGNOSIS — R3915 Urgency of urination: Secondary | ICD-10-CM | POA: Diagnosis not present

## 2016-04-21 DIAGNOSIS — N393 Stress incontinence (female) (male): Secondary | ICD-10-CM | POA: Diagnosis not present

## 2016-04-22 DIAGNOSIS — F419 Anxiety disorder, unspecified: Secondary | ICD-10-CM | POA: Diagnosis not present

## 2016-04-22 DIAGNOSIS — G43909 Migraine, unspecified, not intractable, without status migrainosus: Secondary | ICD-10-CM | POA: Diagnosis not present

## 2016-04-22 DIAGNOSIS — F338 Other recurrent depressive disorders: Secondary | ICD-10-CM | POA: Diagnosis not present

## 2016-04-22 DIAGNOSIS — Z79899 Other long term (current) drug therapy: Secondary | ICD-10-CM | POA: Diagnosis not present

## 2016-04-22 DIAGNOSIS — F902 Attention-deficit hyperactivity disorder, combined type: Secondary | ICD-10-CM | POA: Diagnosis not present

## 2016-05-13 DIAGNOSIS — B370 Candidal stomatitis: Secondary | ICD-10-CM | POA: Diagnosis not present

## 2016-05-13 DIAGNOSIS — L0292 Furuncle, unspecified: Secondary | ICD-10-CM | POA: Diagnosis not present

## 2016-05-14 DIAGNOSIS — G43719 Chronic migraine without aura, intractable, without status migrainosus: Secondary | ICD-10-CM | POA: Diagnosis not present

## 2016-05-16 DIAGNOSIS — M94261 Chondromalacia, right knee: Secondary | ICD-10-CM | POA: Diagnosis not present

## 2016-05-16 DIAGNOSIS — M94262 Chondromalacia, left knee: Secondary | ICD-10-CM | POA: Diagnosis not present

## 2016-05-16 DIAGNOSIS — M7711 Lateral epicondylitis, right elbow: Secondary | ICD-10-CM | POA: Diagnosis not present

## 2016-05-29 DIAGNOSIS — M94262 Chondromalacia, left knee: Secondary | ICD-10-CM | POA: Diagnosis not present

## 2016-06-05 DIAGNOSIS — M94262 Chondromalacia, left knee: Secondary | ICD-10-CM | POA: Diagnosis not present

## 2016-06-14 DIAGNOSIS — G43719 Chronic migraine without aura, intractable, without status migrainosus: Secondary | ICD-10-CM | POA: Diagnosis not present

## 2016-06-16 ENCOUNTER — Telehealth: Payer: Self-pay

## 2016-06-16 NOTE — Telephone Encounter (Signed)
Patient has an app on July 13 @4p  She wanted to know if you could work her in sooner. I informed her your schu was booked. But wanted to ask anyways. Thank you.

## 2016-06-17 NOTE — Telephone Encounter (Signed)
Spoke with patient. Rescheduled for July 7th at 12pm

## 2016-06-26 DIAGNOSIS — G43719 Chronic migraine without aura, intractable, without status migrainosus: Secondary | ICD-10-CM | POA: Diagnosis not present

## 2016-06-27 ENCOUNTER — Ambulatory Visit (INDEPENDENT_AMBULATORY_CARE_PROVIDER_SITE_OTHER): Payer: BLUE CROSS/BLUE SHIELD | Admitting: Family Medicine

## 2016-06-27 ENCOUNTER — Encounter: Payer: Self-pay | Admitting: Family Medicine

## 2016-06-27 VITALS — BP 108/72 | HR 104 | Ht 60.0 in | Wt 122.0 lb

## 2016-06-27 DIAGNOSIS — M9901 Segmental and somatic dysfunction of cervical region: Secondary | ICD-10-CM

## 2016-06-27 DIAGNOSIS — M24551 Contracture, right hip: Secondary | ICD-10-CM | POA: Diagnosis not present

## 2016-06-27 DIAGNOSIS — M9903 Segmental and somatic dysfunction of lumbar region: Secondary | ICD-10-CM

## 2016-06-27 DIAGNOSIS — M9902 Segmental and somatic dysfunction of thoracic region: Secondary | ICD-10-CM

## 2016-06-27 DIAGNOSIS — M999 Biomechanical lesion, unspecified: Secondary | ICD-10-CM

## 2016-06-27 NOTE — Patient Instructions (Signed)
Good to see you as always.  Ice is your friend  Ice 20 minutes 2 times daily. Usually after activity and before bed. Duexis 3 times a day for 3 days.  Stay active but work on hip abductors Exercises on wall.  Heel and butt touching.  Raise leg 6 inches and hold 2 seconds.  Down slow for count of 4 seconds.  1 set of 30 reps daily on both sides.  See me again maybe in 6 weeks.

## 2016-06-27 NOTE — Progress Notes (Signed)
Tawana ScaleZach Jaxton Casale D.O. [facility] [address] [place], [place] [address] Phone: [phone] Subjective:     CC: Hip pain with history of chronic low back pain and sacroiliac joint dysfunction  BJY:[id number]:Subjective Sheila DupesWendi Salazar is a 50 y.o. female coming in with more complaint of low back pain.  Patient does have sacroiliac joint dysfunction. Patient is working on a more regular basis. Unfortunate she continues to have the same pain. Seems to be on the right side. Was doing well but now having worsening pain. Over the course last several weeks significant enough that it is changing some of her daily activities. States that it still happens from the back and radiates anteriorly towards the hip. Patient denies any fevers, chills, any abnormal weight loss. Patient has difficulty even standing long amount of time without it seeming to spasm. Has tried ibuprofen but only does it occasionally because she feels like it is not making improvement.  Past Medical History  Diagnosis Date  . Allergy    No past surgical history on file. Social History  Substance Use Topics  . Smoking status: Never Smoker   . Smokeless tobacco: None  . Alcohol Use: None   Not on File No family history on file.No family history of rheumatological diseases  Past medical history, social, surgical and family history all reviewed and no pertinent info pertaining to chief complaint. All other was reviewed using the EMR.    Review of Systems: No headache, visual changes, nausea, vomiting, diarrhea, constipation, dizziness, abdominal pain, skin rash, fevers, chills, night sweats, weight loss, swollen lymph nodes, body aches, joint swelling, muscle aches, chest pain, shortness of breath, mood changes.   Objective Blood pressure 108/72, pulse 104, height 5' (1.524 m), weight 122 lb (55.339 kg), SpO2 95 %.  General: No apparent distress alert and oriented x3 mood and affect normal, dressed appropriately.  HEENT: Pupils  equal, extraocular movements intact  Respiratory: Patient's speak in full sentences and does not appear short of breath  Cardiovascular: No lower extremity edema, non tender, no erythema  Skin: Warm dry intact with no signs of infection or rash on extremities or on axial skeleton.  Abdomen: nontender.  Lymph: No lymphadenopathy of posterior or anterior cervical chain or axillae bilaterally.  Gait normal with good balance and coordination.  MSK:  Non tender with full range of motion and good stability and symmetric strength and tone of shoulders, elbows, wrist, hip, knee and ankles bilaterally.  Back Exam:  Inspection: Unremarkable  Motion: Flexion 35 deg, Extension 20 deg, Side Bending to 35 deg bilaterally,  Rotation to 35 deg bilaterally  SLR laying: Negative XSLR laying: Negative  Palpable tenderness: Given tightness of the right sided hip flexor as well as the iliacus. Worse than previous exam.  FABER: Right. Sensory change: Gross sensation intact to all lumbar and sacral dermatomes.  Reflexes: 2+ at both patellar tendons, 2+ at achilles tendons, Babinski's downgoing.  Strength at foot  Plantar-flexion: 5/5 Dorsi-flexion: 5/5 Eversion: 5/5 Inversion: 5/5  Leg strength  Quad: 5/5 Hamstring: 5/5 Hip flexor: 5/5 Hip abductors: 4/5  Gait unremarkable.   OMT Physical Exam    Cervical  C2 flexed rotated and side bent left  C4 flexed rotated inside that right  Thoracic  T2 extended rotated and side bent left  T7 extended rotated inside that right Lumbar  L2 flexed rotated inside the left Sacrum  Right on right     Impression and Recommendations:     This case required  medical decision making of moderate complexity.

## 2016-06-27 NOTE — Assessment & Plan Note (Signed)
Decision today to treat with OMT was based on Physical Exam  After verbal consent patient was treated with HVLA and ME techniques in cervical, thoracic, lumbar and sacral areas  Patient tolerated the procedure well with improvement in symptoms  Patient given exercises, stretches and lifestyle modifications  See medications in patient instructions if given  Patient will follow up in 4-6 weeks

## 2016-06-27 NOTE — Progress Notes (Signed)
Pre visit review using our clinic review tool, if applicable. No additional management support is needed unless otherwise documented below in the visit note. 

## 2016-06-27 NOTE — Assessment & Plan Note (Signed)
Patient has had significant hip flexor tightness. Continues to have some of the right sign. Negative think secondary to the weakness of the hip abductors. Encourage her to do that unremarkable basis. Trial of topical as well as oral anti-inflammatories the patient did do for short course of time. Discussed range of motion exercises and core strengthening. Patient will come back and see me again in 4-6 weeks for further evaluation and treatment.

## 2016-07-01 DIAGNOSIS — I10 Essential (primary) hypertension: Secondary | ICD-10-CM | POA: Diagnosis not present

## 2016-07-01 DIAGNOSIS — R41840 Attention and concentration deficit: Secondary | ICD-10-CM | POA: Diagnosis not present

## 2016-07-01 DIAGNOSIS — Z0000 Encounter for general adult medical examination without abnormal findings: Secondary | ICD-10-CM | POA: Diagnosis not present

## 2016-07-01 DIAGNOSIS — M8580 Other specified disorders of bone density and structure, unspecified site: Secondary | ICD-10-CM | POA: Diagnosis not present

## 2016-07-01 DIAGNOSIS — G43909 Migraine, unspecified, not intractable, without status migrainosus: Secondary | ICD-10-CM | POA: Diagnosis not present

## 2016-07-03 ENCOUNTER — Ambulatory Visit: Payer: BLUE CROSS/BLUE SHIELD | Admitting: Family Medicine

## 2016-07-04 DIAGNOSIS — M94262 Chondromalacia, left knee: Secondary | ICD-10-CM | POA: Diagnosis not present

## 2016-07-04 DIAGNOSIS — M1712 Unilateral primary osteoarthritis, left knee: Secondary | ICD-10-CM | POA: Diagnosis not present

## 2016-07-22 DIAGNOSIS — G43109 Migraine with aura, not intractable, without status migrainosus: Secondary | ICD-10-CM | POA: Diagnosis not present

## 2016-07-22 DIAGNOSIS — Z79899 Other long term (current) drug therapy: Secondary | ICD-10-CM | POA: Diagnosis not present

## 2016-07-22 DIAGNOSIS — F411 Generalized anxiety disorder: Secondary | ICD-10-CM | POA: Diagnosis not present

## 2016-07-22 DIAGNOSIS — F902 Attention-deficit hyperactivity disorder, combined type: Secondary | ICD-10-CM | POA: Diagnosis not present

## 2016-07-24 NOTE — Progress Notes (Signed)
  [name] [address] [place], [place] [address] Phone: [phone] Subjective:     CC: Hip pain with history of chronic low back pain and sacroiliac joint dysfunction Follow-up  GLO:[id number]  Sheila Salazar is a 50 y.o. female coming in with more complaint of low back pain.  Patient does have sacroiliac joint dysfunction. Patient has been doing relatively well. Doing the home exercises on a more regular basis. We discussed icing regimen. We have tenderness along more frequently. Patient has not been taking anti-inflammatories. Patient has been more active in start of the gym again which she thinks has improved as well. Does respond very well to manipulation.  Past Medical History:  Diagnosis Date  . Allergy    No past surgical history on file. Social History  Substance Use Topics  . Smoking status: Never Smoker  . Smokeless tobacco: Not on file  . Alcohol use Not on file   Not on File No family history on file.No family history of rheumatological diseases  Past medical history, social, surgical and family history all reviewed and no pertinent info pertaining to chief complaint. All other was reviewed using the EMR.    Review of Systems: No headache, visual changes, nausea, vomiting, diarrhea, constipation, dizziness, abdominal pain, skin rash, fevers, chills, night sweats, weight loss, swollen lymph nodes, body aches, joint swelling, muscle aches, chest pain, shortness of breath, mood changes.   Objective  There were no vitals taken for this visit.  General: No apparent distress alert and oriented x3 mood and affect normal, dressed appropriately.  HEENT: Pupils equal, extraocular movements intact  Respiratory: Patient's speak in full sentences and does not appear short of breath  Cardiovascular: No lower extremity edema, non tender, no erythema  Skin: Warm dry intact with no signs of infection or rash on extremities or on axial skeleton.    Abdomen: nontender.  Lymph: No lymphadenopathy of posterior or anterior cervical chain or axillae bilaterally.  Gait normal with good balance and coordination.  MSK:  Non tender with full range of motion and good stability and symmetric strength and tone of shoulders, elbows, wrist, hip, knee and ankles bilaterally.  Back Exam:  Inspection: Unremarkable  Motion: Flexion 35 deg, Extension 25 deg, Side Bending to 35 deg bilaterally,  Rotation to 35 deg bilaterally  SLR laying: Negative XSLR laying: Negative  Palpable tenderness: Continued mild discomfort on the lateral aspect of the foot FABER: Right. Sensory change: Gross sensation intact to all lumbar and sacral dermatomes.  Reflexes: 2+ at both patellar tendons, 2+ at achilles tendons, Babinski's downgoing.  Strength at foot  Plantar-flexion: 5/5 Dorsi-flexion: 5/5 Eversion: 5/5 Inversion: 5/5  Leg strength  Quad: 5/5 Hamstring: 5/5 Hip flexor: 5/5 Hip abductors: 4/5  Gait unremarkable.   OMT Physical Exam    Cervical  C2 flexed rotated and side bent left  C4 flexed rotated inside that right  Thoracic  T2 extended rotated and side bent left  T5 extended rotated inside that right Lumbar  L2 flexed rotated inside the left Left on left    Impression and Recommendations:     This case required medical decision making of moderate complexity.

## 2016-07-25 ENCOUNTER — Ambulatory Visit (INDEPENDENT_AMBULATORY_CARE_PROVIDER_SITE_OTHER): Payer: BLUE CROSS/BLUE SHIELD | Admitting: Family Medicine

## 2016-07-25 ENCOUNTER — Encounter: Payer: Self-pay | Admitting: Family Medicine

## 2016-07-25 VITALS — BP 98/72 | HR 80 | Wt 124.0 lb

## 2016-07-25 DIAGNOSIS — M533 Sacrococcygeal disorders, not elsewhere classified: Secondary | ICD-10-CM | POA: Diagnosis not present

## 2016-07-25 DIAGNOSIS — M9903 Segmental and somatic dysfunction of lumbar region: Secondary | ICD-10-CM

## 2016-07-25 DIAGNOSIS — M9901 Segmental and somatic dysfunction of cervical region: Secondary | ICD-10-CM | POA: Diagnosis not present

## 2016-07-25 DIAGNOSIS — M999 Biomechanical lesion, unspecified: Secondary | ICD-10-CM

## 2016-07-25 DIAGNOSIS — M9902 Segmental and somatic dysfunction of thoracic region: Secondary | ICD-10-CM

## 2016-07-25 NOTE — Patient Instructions (Signed)
Good to see you  Ice when you need Keep it going on posture 4-5 weeks

## 2016-07-25 NOTE — Assessment & Plan Note (Signed)
Decision today to treat with OMT was based on Physical Exam  After verbal consent patient was treated with HVLA and ME techniques in cervical, thoracic, lumbar and sacral areas  Patient tolerated the procedure well with improvement in symptoms  Patient given exercises, stretches and lifestyle modifications  See medications in patient instructions if given  Patient will follow up in 4-6 weeks

## 2016-07-25 NOTE — Assessment & Plan Note (Signed)
Overall I do think patient will do relatively well. We have discussed possibly repeating the injection if needed. Patient will come back and see me again in 4-6 weeks for further evaluation and treatment.

## 2016-08-21 DIAGNOSIS — H40033 Anatomical narrow angle, bilateral: Secondary | ICD-10-CM | POA: Diagnosis not present

## 2016-08-21 NOTE — Progress Notes (Signed)
  Tawana ScaleZach Somalia Segler D.O. [facility] [address] [place], [place] [address] Phone: [phone](336) [phone] Subjective:     CC: Hip pain with history of chronic low back pain and sacroiliac joint dysfunction Follow-up  BJY:NWGNFAOZHYHPI:Subjective  Karna DupesWendi Doody is a 50 y.o. female coming in with more complaint of low back pain.  Patient does have sacroiliac joint dysfunction. Started having pain again in the last week. Mild discomfort. Nothing severe. No radiation down the leg. Nothing that stopping her from activities. Still though little uncomfortable.  Past Medical History:  Diagnosis Date  . Allergy    No past surgical history on file. Social History  Substance Use Topics  . Smoking status: Never Smoker  . Smokeless tobacco: Not on file  . Alcohol use Not on file   Not on File No family history on file.No family history of rheumatological diseases  Past medical history, social, surgical and family history all reviewed and no pertinent info pertaining to chief complaint. All other was reviewed using the EMR.    Review of Systems: No headache, visual changes, nausea, vomiting, diarrhea, constipation, dizziness, abdominal pain, skin rash, fevers, chills, night sweats, weight loss, swollen lymph nodes, body aches, joint swelling, muscle aches, chest pain, shortness of breath, mood changes.   Objective  Blood pressure 122/70, pulse 88, SpO2 98 %.  General: No apparent distress alert and oriented x3 mood and affect normal, dressed appropriately.  HEENT: Pupils equal, extraocular movements intact  Respiratory: Patient's speak in full sentences and does not appear short of breath  Cardiovascular: No lower extremity edema, non tender, no erythema  Skin: Warm dry intact with no signs of infection or rash on extremities or on axial skeleton.  Abdomen: nontender.  Lymph: No lymphadenopathy of posterior or anterior cervical chain or axillae bilaterally.  Gait normal with good balance and coordination.    MSK:  Non tender with full range of motion and good stability and symmetric strength and tone of shoulders, elbows, wrist, hip, knee and ankles bilaterally.  Back Exam:  Inspection: Unremarkable  Motion: Flexion 35 deg, Extension 25 deg, Side Bending to 35 deg bilaterally,  Rotation to 35 deg bilaterally  SLR laying: Negative XSLR laying: Negative  Palpable tenderness: Continued mild discomfort on the lateral aspect of the foot FABER: Right. Sensory change: Gross sensation intact to all lumbar and sacral dermatomes.  Reflexes: 2+ at both patellar tendons, 2+ at achilles tendons, Babinski's downgoing.  Strength at foot  Plantar-flexion: 5/5 Dorsi-flexion: 5/5 Eversion: 5/5 Inversion: 5/5  Leg strength  Quad: 5/5 Hamstring: 5/5 Hip flexor: 5/5 Hip abductors: 4/5  Gait unremarkable.   OMT Physical Exam    Cervical  C2 flexed rotated and side bent left  C6 flexed rotated inside that right  Thoracic  T2 extended rotated and side bent left  T9 extended rotated inside that right Lumbar  L2 flexed rotated inside the left Left on left    Impression and Recommendations:     This case required medical decision making of moderate complexity.

## 2016-08-21 NOTE — Assessment & Plan Note (Signed)
Decision today to treat with OMT was based on Physical Exam  After verbal consent patient was treated with HVLA and ME techniques in cervical, thoracic, lumbar and sacral areas  Patient tolerated the procedure well with improvement in symptoms  Patient given exercises, stretches and lifestyle modifications  See medications in patient instructions if given  Patient will follow up in 4-8 weeks

## 2016-08-21 NOTE — Assessment & Plan Note (Signed)
Continues to need to work on core strength. Patient has somewhat responded to a sacroiliac joint injection previously. Worsening symptoms we'll consider further workup or even imaging. I do not think that this will be necessary. We discussed icing regimen. Follow-up again in 4-8 weeks.

## 2016-08-22 ENCOUNTER — Ambulatory Visit (INDEPENDENT_AMBULATORY_CARE_PROVIDER_SITE_OTHER): Payer: BLUE CROSS/BLUE SHIELD | Admitting: Family Medicine

## 2016-08-22 ENCOUNTER — Encounter: Payer: Self-pay | Admitting: Family Medicine

## 2016-08-22 VITALS — BP 122/70 | HR 88

## 2016-08-22 DIAGNOSIS — M999 Biomechanical lesion, unspecified: Secondary | ICD-10-CM

## 2016-08-22 DIAGNOSIS — M9902 Segmental and somatic dysfunction of thoracic region: Secondary | ICD-10-CM

## 2016-08-22 DIAGNOSIS — M9901 Segmental and somatic dysfunction of cervical region: Secondary | ICD-10-CM | POA: Diagnosis not present

## 2016-08-22 DIAGNOSIS — M9903 Segmental and somatic dysfunction of lumbar region: Secondary | ICD-10-CM

## 2016-08-22 DIAGNOSIS — M533 Sacrococcygeal disorders, not elsewhere classified: Secondary | ICD-10-CM | POA: Diagnosis not present

## 2016-08-22 NOTE — Patient Instructions (Addendum)
Good to see you  Exercises on wall.  Heel and butt touching.  Raise leg 6 inches and hold 2 seconds.  Down slow for count of 4 seconds.  1 set of 30 reps daily on both sides.  Folate daily  See me again in 6 weeks.

## 2016-10-01 ENCOUNTER — Ambulatory Visit: Payer: BLUE CROSS/BLUE SHIELD | Admitting: Family Medicine

## 2016-12-29 DIAGNOSIS — J069 Acute upper respiratory infection, unspecified: Secondary | ICD-10-CM | POA: Diagnosis not present

## 2017-01-03 DIAGNOSIS — J069 Acute upper respiratory infection, unspecified: Secondary | ICD-10-CM | POA: Diagnosis not present

## 2017-01-22 DIAGNOSIS — Z79899 Other long term (current) drug therapy: Secondary | ICD-10-CM | POA: Diagnosis not present

## 2017-01-22 DIAGNOSIS — G43109 Migraine with aura, not intractable, without status migrainosus: Secondary | ICD-10-CM | POA: Diagnosis not present

## 2017-01-22 DIAGNOSIS — F411 Generalized anxiety disorder: Secondary | ICD-10-CM | POA: Diagnosis not present

## 2017-01-22 DIAGNOSIS — F902 Attention-deficit hyperactivity disorder, combined type: Secondary | ICD-10-CM | POA: Diagnosis not present

## 2017-01-26 DIAGNOSIS — G43719 Chronic migraine without aura, intractable, without status migrainosus: Secondary | ICD-10-CM | POA: Diagnosis not present

## 2017-01-27 DIAGNOSIS — G43719 Chronic migraine without aura, intractable, without status migrainosus: Secondary | ICD-10-CM | POA: Diagnosis not present

## 2017-01-27 DIAGNOSIS — M542 Cervicalgia: Secondary | ICD-10-CM | POA: Diagnosis not present

## 2017-01-27 DIAGNOSIS — G518 Other disorders of facial nerve: Secondary | ICD-10-CM | POA: Diagnosis not present

## 2017-01-27 DIAGNOSIS — M791 Myalgia: Secondary | ICD-10-CM | POA: Diagnosis not present

## 2017-02-09 NOTE — Progress Notes (Signed)
Tawana ScaleZach Smith D.O. [facility] [address] [place], [place] [address] Phone: [phone]([phone] Subjective:     CC: Hip pain with history of chronic low back pain and sacroiliac joint dysfunction Follow-up  WGN:FAOZHYQMVHHPI:Subjective  Sheila DupesWendi Salazar is a 51 y.o. female coming in with more complaint of low back pain.  Patient does have sacroiliac joint dysfunction.  Started having increasing discomfort again. Seems to be more on the left side. Mild radiation to the buttocks region but nothing down the leg. No numbness or tingling.  Worsening symptoms of the previous symptoms. Right knee pain. Worse with squatting. Patient's has been doing more exercise classes recently. Patient states that they have changed her technique and that seems to be causing more discomfort. Denies any swelling. Rates the severity of 5 out of 10. Does not respond well to over-the-counter medications. Increasing pain with going up and down stairs.    Past Medical History:  Diagnosis Date  . Allergy    No past surgical history on file. Social History  Substance Use Topics  . Smoking status: Never Smoker  . Smokeless tobacco: Never Used  . Alcohol use Not on file   Not on File No family history on file.No family history of rheumatological diseases  Past medical history, social, surgical and family history all reviewed and no pertinent info pertaining to chief complaint. All other was reviewed using the EMR.    Review of Systems: No headache, visual changes, nausea, vomiting, diarrhea, constipation, dizziness, abdominal pain, skin rash, fevers, chills, night sweats, weight loss, swollen lymph nodes, body aches, joint swelling, muscle aches, chest pain, shortness of breath, mood changes.    Objective  Blood pressure 100/64, pulse 66, height 5' (1.524 m), weight 127 lb (57.6 kg).  General: No apparent distress alert and oriented x3 mood and affect normal, dressed appropriately.  HEENT: Pupils equal, extraocular  movements intact  Respiratory: Patient's speak in full sentences and does not appear short of breath  Cardiovascular: No lower extremity edema, non tender, no erythema  Skin: Warm dry intact with no signs of infection or rash on extremities or on axial skeleton.  Abdomen: nontender.  Lymph: No lymphadenopathy of posterior or anterior cervical chain or axillae bilaterally.  Gait normal with good balance and coordination.  MSK:  Non tender with full range of motion and good stability and symmetric strength and tone of shoulders, elbows, wrist, hip, and ankles bilaterally.  Knee: Right Normal to inspection with no erythema or effusion or obvious bony abnormalities. Over the patellofemoral ROM full in flexion and extension and lower leg rotation. Ligaments with solid consistent endpoints including ACL, PCL, LCL, MCL. Negative Mcmurray's, Apley's, and Thessalonian tests. painful patellar compression. Patellar glide with mild crepitus. Patellar and quadriceps tendons unremarkable. Hamstring and quadriceps strength is normal.  Contralateral knee presentation sililar  Back Exam:  Inspection: Unremarkable  Motion: Flexion 35 deg, Extension 25 deg, Side Bending to 35 deg bilaterally,  Rotation to 35 deg bilaterally  SLR laying: Negative XSLR laying: Negative  Palpable tenderness: Continue mild discomfort mostly over the left sacroiliac joint FABER: Left side. Sensory change: Gross sensation intact to all lumbar and sacral dermatomes.  Reflexes: 2+ at both patellar tendons, 2+ at achilles tendons, Babinski's downgoing.  Strength at foot  Plantar-flexion: 5/5 Dorsi-flexion: 5/5 Eversion: 5/5 Inversion: 5/5  Leg strength  Quad: 5/5 Hamstring: 5/5 Hip flexor: 5/5 Hip abductors: 4/5  Gait unremarkable.      Osteopathic findings Cervical C2 flexed rotated and side bent  right C5 flexed rotated and side bent left T3 extended rotated and side bent right inhaled third rib T7 extended rotated  and side bent left L2 flexed rotated and side bent right Sacrum right on right     Impression and Recommendations:     This case required medical decision making of moderate complexity.

## 2017-02-10 ENCOUNTER — Ambulatory Visit (INDEPENDENT_AMBULATORY_CARE_PROVIDER_SITE_OTHER): Payer: BLUE CROSS/BLUE SHIELD | Admitting: Family Medicine

## 2017-02-10 ENCOUNTER — Encounter: Payer: Self-pay | Admitting: Family Medicine

## 2017-02-10 VITALS — BP 100/64 | HR 66 | Ht 60.0 in | Wt 127.0 lb

## 2017-02-10 DIAGNOSIS — M222X1 Patellofemoral disorders, right knee: Secondary | ICD-10-CM | POA: Diagnosis not present

## 2017-02-10 DIAGNOSIS — M533 Sacrococcygeal disorders, not elsewhere classified: Secondary | ICD-10-CM

## 2017-02-10 DIAGNOSIS — G43719 Chronic migraine without aura, intractable, without status migrainosus: Secondary | ICD-10-CM | POA: Diagnosis not present

## 2017-02-10 DIAGNOSIS — M542 Cervicalgia: Secondary | ICD-10-CM | POA: Diagnosis not present

## 2017-02-10 DIAGNOSIS — G518 Other disorders of facial nerve: Secondary | ICD-10-CM | POA: Diagnosis not present

## 2017-02-10 DIAGNOSIS — M999 Biomechanical lesion, unspecified: Secondary | ICD-10-CM

## 2017-02-10 DIAGNOSIS — M222X2 Patellofemoral disorders, left knee: Secondary | ICD-10-CM

## 2017-02-10 DIAGNOSIS — M791 Myalgia: Secondary | ICD-10-CM | POA: Diagnosis not present

## 2017-02-10 NOTE — Assessment & Plan Note (Signed)
More sacroiliac his function. More on the left side than her normal right side. Encourage patient to continue to work on core strengthening. We discussed stability. Has topical anti-inflammatories if needed. We discussed icing regimen. Follow-up again with me 4-6 weeks

## 2017-02-10 NOTE — Assessment & Plan Note (Signed)
Patellofemoral Syndrome  Reviewed anatomy using anatomical model and how PFS occurs.  Given rehab exercises handout for VMO, hip abductors, core, entire kinetic chain including proprioception exercises including cone touches, step downs, hip elevations and turn outs.  Could benefit from PT, regular exercise, upright biking, and a PFS knee brace to assist with tracking abnormalities. RTC in 4 weeks  

## 2017-02-10 NOTE — Assessment & Plan Note (Signed)
Decision today to treat with OMT was based on Physical Exam  After verbal consent patient was treated with HVLA, ME, FPR techniques in cervical, thoracic, lumbar and sacral areas  Patient tolerated the procedure well with improvement in symptoms  Patient given exercises, stretches and lifestyle modifications  See medications in patient instructions if given  Patient will follow up in 4 weeks 

## 2017-02-10 NOTE — Patient Instructions (Signed)
Good to see you  Sheila Salazar is your friend.  Exercises 3 times a week.  Do what you know is right for squats See me again in 4 weeks.

## 2017-03-05 DIAGNOSIS — G43719 Chronic migraine without aura, intractable, without status migrainosus: Secondary | ICD-10-CM | POA: Diagnosis not present

## 2017-03-11 ENCOUNTER — Encounter: Payer: Self-pay | Admitting: Family Medicine

## 2017-03-11 ENCOUNTER — Ambulatory Visit (INDEPENDENT_AMBULATORY_CARE_PROVIDER_SITE_OTHER): Payer: BLUE CROSS/BLUE SHIELD | Admitting: Family Medicine

## 2017-03-11 VITALS — BP 116/78 | HR 88 | Ht 60.0 in | Wt 128.0 lb

## 2017-03-11 DIAGNOSIS — M999 Biomechanical lesion, unspecified: Secondary | ICD-10-CM

## 2017-03-11 DIAGNOSIS — M533 Sacrococcygeal disorders, not elsewhere classified: Secondary | ICD-10-CM | POA: Diagnosis not present

## 2017-03-11 MED ORDER — GABAPENTIN 100 MG PO CAPS: 200.0000 mg | ORAL_CAPSULE | Freq: Every day | ORAL | 3 refills | Status: DC

## 2017-03-11 NOTE — Assessment & Plan Note (Signed)
Decision today to treat with OMT was based on Physical Exam  After verbal consent patient was treated with HVLA, ME, FPR techniques in cervical, rib thoracic, lumbar and sacral areas  Patient tolerated the procedure well with improvement in symptoms  Patient given exercises, stretches and lifestyle modifications  See medications in patient instructions if given  Patient will follow up in 3-6 weeks

## 2017-03-11 NOTE — Progress Notes (Signed)
Tawana ScaleZach Dalayza Zambrana D.O. [facility] [address] [place], [place] [address] Phone: [phone]([phone] Subjective:     CC: Hip pain with history of chronic low back pain and sacroiliac joint dysfunction Follow-up  BJY:NWGNFAOZHYHPI:Subjective  Sheila DupesWendi Salazar is a 51 y.o. female coming in with more complaint of low back pain. Patient states that she still has some tightness but seems to be having more upper back pain. Seems to be on the left sign. States that she was having difficulty even turning her head. Patient states that he can catch her breath. Denies any lower joint swelling, any shortness of breath.    Past Medical History:  Diagnosis Date  . Allergy    No past surgical history on file. Social History  Substance Use Topics  . Smoking status: Never Smoker  . Smokeless tobacco: Never Used  . Alcohol use Not on file   Allergies  Allergen Reactions  . Erythromycin   . Sulfa Antibiotics    No family history on file.No family history of rheumatological diseases  Past medical history, social, surgical and family history all reviewed and no pertinent info pertaining to chief complaint. All other was reviewed using the EMR.   Review of Systems: No headache, visual changes, nausea, vomiting, diarrhea, constipation, dizziness, abdominal pain, skin rash, fevers, chills, night sweats, weight loss, swollen lymph nodes, body aches, joint swelling, muscle aches, chest pain, shortness of breath, mood changes.     Objective  Blood pressure 116/78, pulse 88, height 5' (1.524 m), weight 128 lb (58.1 kg), SpO2 99 %.  Systems examined below as of 03/11/17 General: NAD A&O x3 mood, affect normal  HEENT: Pupils equal, extraocular movements intact no nystagmus Respiratory: not short of breath at rest or with speaking Cardiovascular: No lower extremity edema, non tender Skin: Warm dry intact with no signs of infection or rash on extremities or on axial skeleton. Abdomen: Soft nontender, no masses Neuro:  Cranial nerves  intact, neurovascularly intact in all extremities with 2+ DTRs and 2+ pulses. Lymph: No lymphadenopathy appreciated today  Gait normal with good balance and coordination.  MSK: Non tender with full range of motion and good stability and symmetric strength and tone of shoulders, elbows, wrist,  hips and ankles bilaterally.   Neck: Inspection unremarkable. No palpable stepoffs. Negative Spurling's maneuver. tenderness noted left side with mild limitation in rotation and side bending to left Grip strength and sensation normal in bilateral hands Strength good C4 to T1 distribution No sensory change to C4 to T1 Negative Hoffman sign bilaterally Reflexes normal  Back Exam:  Inspection: Unremarkable  Motion: Flexion 35 deg, Extension 25 deg, Side Bending to 30 deg bilaterally,  Rotation to 35 deg bilaterally  SLR laying: Negative XSLR laying: Negative  Palpable tenderness: More pain over the left sacroiliac joint again FABER: Left side positive with mild increasing tightness Sensory change: Gross sensation intact to all lumbar and sacral dermatomes.  Reflexes: 2+ at both patellar tendons, 2+ at achilles tendons, Babinski's downgoing.  Strength at foot  Plantar-flexion: 5/5 Dorsi-flexion: 5/5 Eversion: 5/5 Inversion: 5/5  Leg strength  Quad: 5/5 Hamstring: 5/5 Hip flexor: 5/5 Hip abductors: 4/5  Gait unremarkable.      Osteopathic findings Cervical C2 flexed rotated and side bent right C6 flexed rotated and side bent right T3 extended rotated and side bent right inhaled third rib T9 extended rotated and side bent left L2 flexed rotated and side bent right Sacrum left on left      Impression  and Recommendations:     This case required medical decision making of moderate complexity.

## 2017-03-11 NOTE — Patient Instructions (Addendum)
Good to see you  Sheila Salazar is your friend.  Gabapentin 200mg  at night  Take 1/2 the ambien Tilt the butt out ;) See me again in 3 weeks

## 2017-03-11 NOTE — Assessment & Plan Note (Signed)
Can use to have dysfunction. Pain she states seems to be worse at night and will start on gabapentin. Warned of potential side effects. We discussed other medications that I think will be beneficial. We can potentially neck some changes in the medications. Hopefully we will be able to consolidate in the long run. Once again discussed the possibility of the hip abductors. Follow-up again in 3-6 weeks.

## 2017-03-12 DIAGNOSIS — Z01419 Encounter for gynecological examination (general) (routine) without abnormal findings: Secondary | ICD-10-CM | POA: Diagnosis not present

## 2017-03-12 DIAGNOSIS — Z6824 Body mass index (BMI) 24.0-24.9, adult: Secondary | ICD-10-CM | POA: Diagnosis not present

## 2017-03-13 ENCOUNTER — Other Ambulatory Visit: Payer: Self-pay | Admitting: Obstetrics and Gynecology

## 2017-03-13 DIAGNOSIS — N644 Mastodynia: Secondary | ICD-10-CM

## 2017-03-17 ENCOUNTER — Other Ambulatory Visit: Payer: BLUE CROSS/BLUE SHIELD

## 2017-03-18 ENCOUNTER — Ambulatory Visit
Admission: RE | Admit: 2017-03-18 | Discharge: 2017-03-18 | Disposition: A | Discharge status: Home / Self Care | Payer: BLUE CROSS/BLUE SHIELD | Source: Ambulatory Visit | Attending: Obstetrics and Gynecology | Admitting: Obstetrics and Gynecology

## 2017-03-18 DIAGNOSIS — N644 Mastodynia: Secondary | ICD-10-CM

## 2017-03-18 DIAGNOSIS — R922 Inconclusive mammogram: Secondary | ICD-10-CM | POA: Diagnosis not present

## 2017-03-18 DIAGNOSIS — N6012 Diffuse cystic mastopathy of left breast: Secondary | ICD-10-CM | POA: Diagnosis not present

## 2017-04-01 ENCOUNTER — Encounter: Payer: Self-pay | Admitting: Family Medicine

## 2017-04-01 ENCOUNTER — Ambulatory Visit (INDEPENDENT_AMBULATORY_CARE_PROVIDER_SITE_OTHER): Payer: BLUE CROSS/BLUE SHIELD | Admitting: Family Medicine

## 2017-04-01 VITALS — BP 104/66 | HR 73 | Resp 16 | Wt 128.1 lb

## 2017-04-01 DIAGNOSIS — M533 Sacrococcygeal disorders, not elsewhere classified: Secondary | ICD-10-CM

## 2017-04-01 DIAGNOSIS — M999 Biomechanical lesion, unspecified: Secondary | ICD-10-CM

## 2017-04-01 NOTE — Progress Notes (Signed)
Sheila Salazar [address] [place], [place] [address] Phone: [phone] Subjective:     CC: Hip pain with history of chronic low back pain and sacroiliac joint dysfunction Follow-up  BJY:NWGNFAOZHY  Sheila Salazar is a 51 y.o. female coming in with more complaint of low back pain. Patient didn't change different exercises and has been doing very well with the posture and ergonomics. Feels like that has made some improvement. No radiation the pain. States that she feels like she is making improvement.   Past Medical History:  Diagnosis Date  . Allergy    History reviewed. No pertinent surgical history. Social History  Substance Use Topics  . Smoking status: Never Smoker  . Smokeless tobacco: Never Used  . Alcohol use Not on file   Allergies  Allergen Reactions  . Erythromycin   . Sulfa Antibiotics    Family History  Problem Relation Age of Onset  . Breast cancer Mother   No family history of rheumatological diseases  Past medical history, social, surgical and family history all reviewed and no pertinent info pertaining to chief complaint. All other was reviewed using the EMR.   Review of Systems: No headache, visual changes, nausea, vomiting, diarrhea, constipation, dizziness, abdominal pain, skin rash, fevers, chills, night sweats, weight loss, swollen lymph nodes, body aches, joint swelling, muscle aches, chest pain, shortness of breath, mood changes.     Objective  Blood pressure 104/66, pulse 73, resp. rate 16, weight 128 lb 2 oz (58.1 kg), SpO2 98 %.  Systems examined below as of 04/01/17 General: NAD A&O x3 mood, affect normal  HEENT: Pupils equal, extraocular movements intact no nystagmus Respiratory: not short of breath at rest or with speaking Cardiovascular: No lower extremity edema, non tender Skin: Warm dry intact with no signs of infection or rash on extremities or on axial skeleton. Abdomen: Soft nontender, no masses Neuro:  Cranial nerves  intact, neurovascularly intact in all extremities with 2+ DTRs and 2+ pulses. Lymph: No lymphadenopathy appreciated today  Gait normal with good balance and coordination.  MSK: Non tender with full range of motion and good stability and symmetric strength and tone of shoulders, elbows, wrist,  knee hips and ankles bilaterally.   Neck: Inspection unremarkable. No palpable stepoffs. Negative Spurling's maneuver. Some mild tenderness on the left sign but full range of motion. Grip strength and sensation normal in bilateral hands Strength good C4 to T1 distribution No sensory change to C4 to T1 Negative Hoffman sign bilaterally Reflexes normal  Back Exam:  Inspection: Unremarkable  Motion: Flexion 45 deg, Extension 25 deg, Side Bending to 45 deg bilaterally,  Rotation to 45 deg bilaterally  SLR laying: Negative  XSLR laying: Negative  Palpable tenderness: Mild tenderness over the left sacral iliac joint. FABER: Positive left. Sensory change: Gross sensation intact to all lumbar and sacral dermatomes.  Reflexes: 2+ at both patellar tendons, 2+ at achilles tendons, Babinski's downgoing.  Strength at foot  Plantar-flexion: 5/5 Dorsi-flexion: 5/5 Eversion: 5/5 Inversion: 5/5  Leg strength  Quad: 5/5 Hamstring: 5/5 Hip flexor: 5/5 Hip abductors: 5/5  Gait unremarkable.     Osteopathic findings Cervical C2 flexed rotated and side bent right T3 extended rotated and side bent right inhaled third rib T7 extended rotated and side bent left L2 flexed rotated and side bent right Sacrum right on right      Impression and Recommendations:     This case required medical decision making of moderate complexity.

## 2017-04-01 NOTE — Progress Notes (Signed)
Pre-visit discussion using our clinic review tool. No additional management support is needed unless otherwise documented below in the visit note.  

## 2017-04-01 NOTE — Assessment & Plan Note (Signed)
Patient is doing much better than last exam. Discussed with patient at this time. Discussed continuing increasing activity discussed with patient about avoiding certain activities. Patient will continue to increase activity. Follow-up again in 4-6 weeks.

## 2017-04-01 NOTE — Assessment & Plan Note (Signed)
Decision today to treat with OMT was based on Physical Exam  After verbal consent patient was treated with HVLA, ME, FPR techniques in cervical, thoracic, lumbar and sacral areas  Patient tolerated the procedure well with improvement in symptoms  Patient given exercises, stretches and lifestyle modifications  See medications in patient instructions if given  Patient will follow up in 4-6 weeks 

## 2017-04-01 NOTE — Patient Instructions (Signed)
email me a new med list  Sheila Salazar is your friend.  Keep working on the posture Consider the other stretch  See me again in 4-6 weeks

## 2017-04-02 ENCOUNTER — Encounter: Payer: Self-pay | Admitting: Family Medicine

## 2017-04-09 ENCOUNTER — Encounter: Payer: Self-pay | Admitting: Podiatry

## 2017-04-09 ENCOUNTER — Ambulatory Visit (INDEPENDENT_AMBULATORY_CARE_PROVIDER_SITE_OTHER): Payer: BLUE CROSS/BLUE SHIELD | Admitting: Podiatry

## 2017-04-09 VITALS — Ht 60.0 in | Wt 128.0 lb

## 2017-04-09 DIAGNOSIS — M79672 Pain in left foot: Secondary | ICD-10-CM | POA: Diagnosis not present

## 2017-04-09 DIAGNOSIS — M21962 Unspecified acquired deformity of left lower leg: Secondary | ICD-10-CM

## 2017-04-09 DIAGNOSIS — M205X2 Other deformities of toe(s) (acquired), left foot: Secondary | ICD-10-CM | POA: Diagnosis not present

## 2017-04-09 DIAGNOSIS — M216X2 Other acquired deformities of left foot: Secondary | ICD-10-CM

## 2017-04-09 NOTE — Progress Notes (Signed)
SUBJECTIVE: 51 y.o. year old female presents complaining of pain on top and under the first MPJ left foot especially in the morning, hard to walk at times due to pain Stated that she was treated with cortisone injection, which gave her very limited relief. She is active in her feet and wears high heels while at work. Patient stated that it is very important for her to be able to wear high heels at work.  Left foot Bunion surgery was done by an orthopedic doctor 4-5 years ago. Patient was referred by Dr. Zachary George.   REVIEW OF SYSTEMS: A comprehensive review of systems was negative except for: Migrane and had botox twice that helped. Hormone therapy for Menopause.   OBJECTIVE: DERMATOLOGIC EXAMINATION: No abnormal skin lesions noted.   VASCULAR EXAMINATION OF LOWER LIMBS: All pedal pulses are palpable with normal pulsation.  Capillary Filling times within 3 seconds in all digits.  No edema or erythema noted. Temperature gradient from tibial crest to dorsum of foot is within normal bilateral.  NEUROLOGIC EXAMINATION OF THE LOWER LIMBS: All epicritic and tactile sensations grossly intact.  MUSCULOSKELETAL EXAMINATION: Positive for limited first MPJ motion left with pain. Dorsally elevated first ray with forefoot loading bilateral.  Patient brought X-rays from Dr. Tasia Catchings office. AP View:  Noted of excess abnormal bone formation at the head of the first metatarsal and at the base of the proximal phalanx of the hallux left foot. Mild flattening of first metatarsal head noted. Metatarsal length is about -1 on left. Lateral view:  First ray elevatus was noted.   ASSESSMENT: Hallux limitus left. Pain at the first MPJ left with activity. First ray elevatus bilateral.  PLAN: Reviewed clinical findings and available treatment options, injection, binder, shoe and orthotics, surgical options. As per request, discussed possible surgical options and discouraged implant procedure since she  will be wearing high heels that would cause excess stress to the implant. Surgery consent form reviewed for biplane decompressing osteotomy of the first metatarsal head. Patient will call for surgery date. Patient is to try Metatarsal binder during activity.

## 2017-04-09 NOTE — Patient Instructions (Signed)
Seen for painful joint first MPJ L>R. Reviewed clinical findings and available treatment options.  Reviewed surgery consent form for biplane decompression osteotomy first metatarsal left.  Metatarsal binder size small x 1 pr dispensed.

## 2017-04-15 DIAGNOSIS — M791 Myalgia: Secondary | ICD-10-CM | POA: Diagnosis not present

## 2017-04-15 DIAGNOSIS — G518 Other disorders of facial nerve: Secondary | ICD-10-CM | POA: Diagnosis not present

## 2017-04-15 DIAGNOSIS — M542 Cervicalgia: Secondary | ICD-10-CM | POA: Diagnosis not present

## 2017-04-15 DIAGNOSIS — G43719 Chronic migraine without aura, intractable, without status migrainosus: Secondary | ICD-10-CM | POA: Diagnosis not present

## 2017-04-22 ENCOUNTER — Telehealth: Payer: Self-pay | Admitting: *Deleted

## 2017-04-22 MED ORDER — GABAPENTIN 100 MG PO CAPS: 200.0000 mg | ORAL_CAPSULE | Freq: Every day | ORAL | 0 refills | Status: DC

## 2017-04-22 NOTE — Telephone Encounter (Signed)
Rec'd fax stating sue to insurance plan requires 90 day on prescriptions. Requesting rx for Gabapentin to be resent for 90 day. Resent to [facility] for 90 day...Raechel Chute

## 2017-05-06 ENCOUNTER — Ambulatory Visit: Payer: BLUE CROSS/BLUE SHIELD | Admitting: Family Medicine

## 2017-05-08 ENCOUNTER — Ambulatory Visit (INDEPENDENT_AMBULATORY_CARE_PROVIDER_SITE_OTHER): Payer: BLUE CROSS/BLUE SHIELD | Admitting: Family Medicine

## 2017-05-08 ENCOUNTER — Encounter: Payer: Self-pay | Admitting: Family Medicine

## 2017-05-08 VITALS — BP 110/74 | HR 96 | Ht 60.0 in | Wt 126.0 lb

## 2017-05-08 DIAGNOSIS — M533 Sacrococcygeal disorders, not elsewhere classified: Secondary | ICD-10-CM | POA: Diagnosis not present

## 2017-05-08 DIAGNOSIS — M999 Biomechanical lesion, unspecified: Secondary | ICD-10-CM | POA: Diagnosis not present

## 2017-05-08 NOTE — Progress Notes (Signed)
Sheila ScaleZach Adalene Salazar D.O. [facility] [address] [place], [place] [address] Phone: [phone] Subjective:     CC: Hip pain with history of chronic low back pain and sacroiliac joint dysfunction Follow-up  BJY:NWGNFAOZHYHPI:Subjective  Sheila DupesWendi Salazar is a 51 y.o. female coming in with more complaint of low back pain. Patient hasn't felt the one sacroiliac joint. No radicular symptoms at this point. Feeling like she is doing relatively well but continues to have the chronic pain over the sacroiliac joint mostly on the right side. Patient continues to try to be active. Has had some increasing stress recently. Patient states some mild worsening from previous exam.   Past Medical History:  Diagnosis Date  . Allergy    No past surgical history on file. Social History  Substance Use Topics  . Smoking status: Never Smoker  . Smokeless tobacco: Never Used  . Alcohol use Not on file   Allergies  Allergen Reactions  . Erythromycin   . Sulfa Antibiotics    Family History  Problem Relation Age of Onset  . Breast cancer Mother   No family history of rheumatological diseases  Past medical history, social, surgical and family history all reviewed and no pertinent info pertaining to chief complaint. All other was reviewed using the EMR.   Review of Systems: No headache, visual changes, nausea, vomiting, diarrhea, constipation, dizziness, abdominal pain, skin rash, fevers, chills, night sweats, weight loss, swollen lymph nodes, body aches, joint swelling, muscle aches, chest pain, shortness of breath, mood changes.     Objective  Blood pressure 110/74, pulse 96, height 5' (1.524 m), weight 126 lb (57.2 kg), SpO2 97 %.  Systems examined below as of 05/08/17 General: NAD A&O x3 mood, affect normal  HEENT: Pupils equal, extraocular movements intact no nystagmus Respiratory: not short of breath at rest or with speaking Cardiovascular: No lower extremity edema, non tender Skin: Warm dry intact with  no signs of infection or rash on extremities or on axial skeleton. Abdomen: Soft nontender, no masses Neuro: Cranial nerves  intact, neurovascularly intact in all extremities with 2+ DTRs and 2+ pulses. Lymph: No lymphadenopathy appreciated today  Gait normal with good balance and coordination.  MSK: Non tender with full range of motion and good stability and symmetric strength and tone of shoulders, elbows, wrist,  knee hips and ankles bilaterally.   Neck: Inspection unremarkable. No palpable stepoffs. Negative Spurling's maneuver. Full neck range of motion Grip strength and sensation normal in bilateral hands Strength good C4 to T1 distribution No sensory change to C4 to T1 Negative Hoffman sign bilaterally Reflexes normal   Back Exam:  Inspection: Unremarkable  Motion: Flexion 45 deg, Extension 20 deg, Side Bending to 35 deg bilaterally,  Rotation to 45 deg bilaterally  SLR laying: Negative  XSLR laying: Negative  Palpable tenderness: Increasing discomfort over the left and right sacroiliac joint. FABER: Positive right. Sensory change: Gross sensation intact to all lumbar and sacral dermatomes.  Reflexes: 2+ at both patellar tendons, 2+ at achilles tendons, Babinski's downgoing.  Strength at foot  Plantar-flexion: 5/5 Dorsi-flexion: 5/5 Eversion: 5/5 Inversion: 5/5  Leg strength  Quad: 5/5 Hamstring: 5/5 Hip flexor: 5/5 Hip abductors: 5/5  Gait unremarkable.      Osteopathic findings C4 flexed rotated and side bent left C6 flexed rotated and side bent left T3 extended rotated and side bent right inhaled third rib T7 extended rotated and side bent left L3 flexed rotated and side bent right Sacrum right on right  Impression and Recommendations:     This case required medical decision making of moderate complexity.

## 2017-05-08 NOTE — Patient Instructions (Signed)
Good to see you  Sheila Salazar is your friend.  Abductors are key  Work on the posture See me again in 4-6 weeks

## 2017-05-08 NOTE — Assessment & Plan Note (Signed)
Decision today to treat with OMT was based on Physical Exam  After verbal consent patient was treated with HVLA, ME, FPR techniques in cervical, thoracic, rib lumbar and sacral areas  Patient tolerated the procedure well with improvement in symptoms  Patient given exercises, stretches and lifestyle modifications  See medications in patient instructions if given  Patient will follow up in 4-6 weeks 

## 2017-05-08 NOTE — Assessment & Plan Note (Signed)
Patient is doing relatively well overall. We discussed again about posture. Consider pelvic rehabilitation. May also consider x-rays sacroiliac injection. Patient will consider this. We discussed icing regimen. Patient will come back and see me again in 4-6 weeks.

## 2017-05-12 DIAGNOSIS — G43719 Chronic migraine without aura, intractable, without status migrainosus: Secondary | ICD-10-CM | POA: Diagnosis not present

## 2017-06-03 DIAGNOSIS — G518 Other disorders of facial nerve: Secondary | ICD-10-CM | POA: Diagnosis not present

## 2017-06-03 DIAGNOSIS — G43719 Chronic migraine without aura, intractable, without status migrainosus: Secondary | ICD-10-CM | POA: Diagnosis not present

## 2017-06-03 DIAGNOSIS — M791 Myalgia: Secondary | ICD-10-CM | POA: Diagnosis not present

## 2017-06-03 DIAGNOSIS — M542 Cervicalgia: Secondary | ICD-10-CM | POA: Diagnosis not present

## 2017-06-04 ENCOUNTER — Encounter: Payer: BLUE CROSS/BLUE SHIELD | Admitting: Podiatry

## 2017-06-17 ENCOUNTER — Encounter: Payer: Self-pay | Admitting: Family Medicine

## 2017-06-17 ENCOUNTER — Ambulatory Visit: Payer: Self-pay

## 2017-06-17 ENCOUNTER — Ambulatory Visit (INDEPENDENT_AMBULATORY_CARE_PROVIDER_SITE_OTHER): Payer: BLUE CROSS/BLUE SHIELD | Admitting: Family Medicine

## 2017-06-17 VITALS — BP 110/82 | HR 76 | Ht 60.0 in | Wt 127.0 lb

## 2017-06-17 DIAGNOSIS — M533 Sacrococcygeal disorders, not elsewhere classified: Secondary | ICD-10-CM

## 2017-06-17 DIAGNOSIS — M545 Low back pain: Secondary | ICD-10-CM

## 2017-06-17 MED ORDER — TRIAMCINOLONE ACETONIDE 40 MG/ML IJ SUSP
40.0000 mg | Freq: Once | INTRAMUSCULAR | Status: AC
Start: 2017-06-17 — End: 2017-06-17
  Administered 2017-06-17: 40 mg via INTRAMUSCULAR

## 2017-06-17 NOTE — Patient Instructions (Signed)
Good to see you  Ice is your friend Stay active Ganglion cyst  Lets check it out in 4 weeks!

## 2017-06-17 NOTE — Assessment & Plan Note (Signed)
Patient given another injection today. We did one approximate 3 years ago with some fairly good results. Encouraged gabapentin. Patient does have an overlying cyst will continue to monitor. She will come back and see me again in 3-4 weeks and restart manipulation.

## 2017-06-17 NOTE — Progress Notes (Signed)
Sheila ScaleZach Salazar D.O. [facility] [address] [place], [place] [address] Phone: [phone] Subjective:     CC: Right lower back pain  QMV:[id number]:Subjective  Sheila DupesWendi Salazar is a 51 y.o. female coming in with complaint of right lower back pain. Patient has had more of a sacroiliac dysfunction previously. Patient states that the pain is worse recently. Has noticed more of a bump in the area. Patient rates the severity pain is 8 out of 10. Patient denies any significant radiation down the leg but can have some soreness on the right hip. Patient states that the ligament seems to improve it is rubbing the area.     Past Medical History:  Diagnosis Date  . Allergy    No past surgical history on file. Social History   Social History  . Marital status: Single    Spouse name: N/A  . Number of children: N/A  . Years of education: N/A   Social History Main Topics  . Smoking status: Never Smoker  . Smokeless tobacco: Never Used  . Alcohol use None  . Drug use: Unknown  . Sexual activity: Not Asked   Other Topics Concern  . None   Social History Narrative  . None   Allergies  Allergen Reactions  . Erythromycin   . Sulfa Antibiotics    Family History  Problem Relation Age of Onset  . Breast cancer Mother     Past medical history, social, surgical and family history all reviewed in electronic medical record.  No pertanent information unless stated regarding to the chief complaint.   Review of Systems:Review of systems updated and as accurate as of 06/17/17  No headache, visual changes, nausea, vomiting, diarrhea, constipation, dizziness, abdominal pain, skin rash, fevers, chills, night sweats, weight loss, swollen lymph nodes, body aches, joint swelling, muscle aches, chest pain, shortness of breath, mood changes.   Objective  Blood pressure 110/82, pulse 76, height 5' (1.524 m), weight 127 lb (57.6 kg). Systems examined below as of 06/17/17   General: No apparent  distress alert and oriented x3 mood and affect normal, dressed appropriately.  HEENT: Pupils equal, extraocular movements intact  Respiratory: Patient's speak in full sentences and does not appear short of breath  Cardiovascular: No lower extremity edema, non tender, no erythema  Skin: Warm dry intact with no signs of infection or rash on extremities or on axial skeleton.  Abdomen: Soft nontender  Neuro: Cranial nerves II through XII are intact, neurovascularly intact in all extremities with 2+ DTRs and 2+ pulses.  Lymph: No lymphadenopathy of posterior or anterior cervical chain or axillae bilaterally.  Gait normal with good balance and coordination.  MSK:  Non tender with full range of motion and good stability and symmetric strength and tone of shoulders, elbows, wrist, hip, knee and ankles bilaterally.  Back Exam:  Inspection: Unremarkable  Motion: Flexion 45 deg, Extension 25 deg, Side Bending to 45 deg bilaterally,  Rotation to 45 deg bilaterally  SLR laying: Negative  XSLR laying: Negative  Palpable tenderness: Tender to palpation of the paraspinal musculature lumbar spine mostly on the right sacroiliac joint. Patient does have a cystic formation that is movable over the right sacroiliac joint FABER: Positive on the right. Sensory change: Gross sensation intact to all lumbar and sacral dermatomes.  Reflexes: 2+ at both patellar tendons, 2+ at achilles tendons, Babinski's downgoing.  Strength at foot  Plantar-flexion: 5/5 Dorsi-flexion: 5/5 Eversion: 5/5 Inversion: 5/5  Leg strength  Quad: 5/5 Hamstring: 5/5 Hip  flexor: 5/5 Hip abductors: 5/5  Gait unremarkable.  Procedure: Real-time Ultrasound Guided Injection of right  sacroiliac joint Device: GE Logiq Q7 Ultrasound guided injection is preferred based studies that show increased duration, increased effect, greater accuracy, decreased procedural pain, increased response rate, and decreased cost with ultrasound guided versus blind  injection.  Verbal informed consent obtained.  Time-out conducted.  Noted no overlying erythema, induration, or other signs of local infection.  Skin prepped in a sterile fashion.  Local anesthesia: Topical Ethyl chloride.  With sterile technique and under real time ultrasound guidance:  21-gauge 2 inch needle patient was injected into the right sacroiliac joint with a total of 1 mL of 0.5% Marcaine and 1 mL of Kenalog 40 mg/dL. Completed without difficulty  Pain immediately resolved suggesting accurate placement of the medication.  Advised to call if fevers/chills, erythema, induration, drainage, or persistent bleeding.  Images permanently stored and available for review in the ultrasound unit.  Impression: Technically successful ultrasound guided injection.     Impression and Recommendations:     This case required medical decision making of moderate complexity.      Note: This dictation was prepared with Dragon dictation along with smaller phrase technology. Any transcriptional errors that result from this process are unintentional.

## 2017-07-15 ENCOUNTER — Encounter: Payer: Self-pay | Admitting: Family Medicine

## 2017-07-15 ENCOUNTER — Ambulatory Visit (INDEPENDENT_AMBULATORY_CARE_PROVIDER_SITE_OTHER): Payer: BLUE CROSS/BLUE SHIELD | Admitting: Family Medicine

## 2017-07-15 VITALS — BP 116/82 | HR 81 | Ht 60.0 in | Wt 127.0 lb

## 2017-07-15 DIAGNOSIS — M999 Biomechanical lesion, unspecified: Secondary | ICD-10-CM | POA: Diagnosis not present

## 2017-07-15 DIAGNOSIS — M533 Sacrococcygeal disorders, not elsewhere classified: Secondary | ICD-10-CM | POA: Diagnosis not present

## 2017-07-15 NOTE — Assessment & Plan Note (Signed)
Decision today to treat with OMT was based on Physical Exam  After verbal consent patient was treated with HVLA, ME, FPR techniques in cervical, thoracic, lumbar and sacral areas  Patient tolerated the procedure well with improvement in symptoms  Patient given exercises, stretches and lifestyle modifications  See medications in patient instructions if given  Patient will follow up in 4-6 weeks 

## 2017-07-15 NOTE — Patient Instructions (Signed)
Good to see you  Keep it up  Try to eat every 2 hours if you can  Try to find a glucomter and check at home if we get some I will call you  See me again in 4-6 weeks

## 2017-07-15 NOTE — Assessment & Plan Note (Signed)
Stable 1. No significant improvement with the injection. We discussed the possibility of this pain lumbar radiculopathy and possible MRI. Patient declined. We'll continue with conservative therapy. No new medications. Follow-up again in 4-6 weeks

## 2017-07-15 NOTE — Progress Notes (Signed)
Sheila Salazar D.O. [facility] [address] [place], [place] [address] Phone: [phone] Subjective:     CC: Right lower back pain  BJY:NWGNFAOZHYHPI:Subjective  Sheila DupesWendi Salazar is a 51 y.o. female coming in with complaint of right lower back pain. Patient has had more of a sacroiliac dysfunction previously. Patient was having worsening symptoms and we didn't attempt a sacroiliac joint injection 06/17/2017. Patient states Minimal improvement. Continues to have a dull aching sensation. Feels that the cyst that was in the area is smaller.     Past Medical History:  Diagnosis Date  . Allergy    No past surgical history on file. Social History   Social History  . Marital status: Single    Spouse name: N/A  . Number of children: N/A  . Years of education: N/A   Social History Main Topics  . Smoking status: Never Smoker  . Smokeless tobacco: Never Used  . Alcohol use None  . Drug use: Unknown  . Sexual activity: Not Asked   Other Topics Concern  . None   Social History Narrative  . None   Allergies  Allergen Reactions  . Erythromycin   . Sulfa Antibiotics    Family History  Problem Relation Age of Onset  . Breast cancer Mother     Past medical history, social, surgical and family history all reviewed in electronic medical record.  No pertanent information unless stated regarding to the chief complaint.   Review of Systems: No headache, visual changes, nausea, vomiting, diarrhea, constipation, dizziness, abdominal pain, skin rash, fevers, chills, night sweats, weight loss, swollen lymph nodes, body aches, joint swelling, muscle aches, chest pain, shortness of breath, mood changes.    Objective  Blood pressure 116/82, pulse 81, height 5' (1.524 m), weight 127 lb (57.6 kg), SpO2 98 %.   Systems examined below as of 07/15/17 General: NAD A&O x3 mood, affect normal  HEENT: Pupils equal, extraocular movements intact no nystagmus Respiratory: not short of breath at rest or  with speaking Cardiovascular: No lower extremity edema, non tender Skin: Warm dry intact with no signs of infection or rash on extremities or on axial skeleton. Abdomen: Soft nontender, no masses Neuro: Cranial nerves  intact, neurovascularly intact in all extremities with 2+ DTRs and 2+ pulses. Lymph: No lymphadenopathy appreciated today  Gait normal with good balance and coordination.  MSK: Non tender with full range of motion and good stability and symmetric strength and tone of shoulders, elbows, wrist,  knee hips and ankles bilaterally.   Back Exam:  Inspection: Mild loss of lordosis Motion: Flexion 45 deg, Extension 25 deg, Side Bending to 45 deg bilaterally,  Rotation to 45 deg bilaterally  SLR laying: Negative  XSLR laying: Negative  Palpable tenderness: Tender over the right sacroiliac joint FABER: Positive right. Sensory change: Gross sensation intact to all lumbar and sacral dermatomes.  Reflexes: 2+ at both patellar tendons, 2+ at achilles tendons, Babinski's downgoing.  Strength at foot  Plantar-flexion: 5/5 Dorsi-flexion: 5/5 Eversion: 5/5 Inversion: 5/5  Leg strength  Quad: 5/5 Hamstring: 5/5 Hip flexor: 5/5 Hip abductors: 5/5  Gait unremarkable.   Osteopathic findings C2 flexed rotated and side bent right C4 flexed rotated and side bent left C7 flexed rotated and side bent left T3 extended rotated and side bent right inhaled third rib T9 extended rotated and side bent left L4 flexed rotated and side bent right Sacrum right on right     Impression and Recommendations:     This  case required medical decision making of moderate complexity.      Note: This dictation was prepared with Dragon dictation along with smaller phrase technology. Any transcriptional errors that result from this process are unintentional.

## 2017-07-24 DIAGNOSIS — Z79899 Other long term (current) drug therapy: Secondary | ICD-10-CM | POA: Diagnosis not present

## 2017-07-24 DIAGNOSIS — F902 Attention-deficit hyperactivity disorder, combined type: Secondary | ICD-10-CM | POA: Diagnosis not present

## 2017-07-24 DIAGNOSIS — F419 Anxiety disorder, unspecified: Secondary | ICD-10-CM | POA: Diagnosis not present

## 2017-07-28 ENCOUNTER — Other Ambulatory Visit: Payer: Self-pay | Admitting: Family Medicine

## 2017-07-28 NOTE — Telephone Encounter (Signed)
Refill done.  

## 2017-07-30 DIAGNOSIS — M791 Myalgia: Secondary | ICD-10-CM | POA: Diagnosis not present

## 2017-07-30 DIAGNOSIS — M542 Cervicalgia: Secondary | ICD-10-CM | POA: Diagnosis not present

## 2017-07-30 DIAGNOSIS — G518 Other disorders of facial nerve: Secondary | ICD-10-CM | POA: Diagnosis not present

## 2017-07-30 DIAGNOSIS — G43719 Chronic migraine without aura, intractable, without status migrainosus: Secondary | ICD-10-CM | POA: Diagnosis not present

## 2017-08-11 DIAGNOSIS — G43719 Chronic migraine without aura, intractable, without status migrainosus: Secondary | ICD-10-CM | POA: Diagnosis not present

## 2017-08-11 DIAGNOSIS — Z79899 Other long term (current) drug therapy: Secondary | ICD-10-CM | POA: Diagnosis not present

## 2017-08-11 DIAGNOSIS — F902 Attention-deficit hyperactivity disorder, combined type: Secondary | ICD-10-CM | POA: Diagnosis not present

## 2017-08-14 DIAGNOSIS — F902 Attention-deficit hyperactivity disorder, combined type: Secondary | ICD-10-CM | POA: Diagnosis not present

## 2017-08-14 DIAGNOSIS — Z79899 Other long term (current) drug therapy: Secondary | ICD-10-CM | POA: Diagnosis not present

## 2017-08-18 DIAGNOSIS — Z0000 Encounter for general adult medical examination without abnormal findings: Secondary | ICD-10-CM | POA: Diagnosis not present

## 2017-08-18 DIAGNOSIS — I10 Essential (primary) hypertension: Secondary | ICD-10-CM | POA: Diagnosis not present

## 2017-08-19 ENCOUNTER — Ambulatory Visit (INDEPENDENT_AMBULATORY_CARE_PROVIDER_SITE_OTHER): Payer: BLUE CROSS/BLUE SHIELD | Admitting: Family Medicine

## 2017-08-19 ENCOUNTER — Encounter: Payer: Self-pay | Admitting: Family Medicine

## 2017-08-19 VITALS — BP 108/80 | HR 84 | Wt 127.0 lb

## 2017-08-19 DIAGNOSIS — M533 Sacrococcygeal disorders, not elsewhere classified: Secondary | ICD-10-CM | POA: Diagnosis not present

## 2017-08-19 DIAGNOSIS — M999 Biomechanical lesion, unspecified: Secondary | ICD-10-CM

## 2017-08-19 NOTE — Patient Instructions (Signed)
Good to see you  6 weeks Keep it up!

## 2017-08-19 NOTE — Assessment & Plan Note (Signed)
Decision today to treat with OMT was based on Physical Exam  After verbal consent patient was treated with HVLA, ME, FPR techniques in cervical, thoracic, lumbar and sacral areas  Patient tolerated the procedure well with improvement in symptoms  Patient given exercises, stretches and lifestyle modifications  See medications in patient instructions if given  Patient will follow up in 6 weeks 

## 2017-08-19 NOTE — Progress Notes (Signed)
Tawana ScaleZach Smith D.O. [facility] [address] [place], [place] [address] Phone: [phone] Subjective:    I'm seeing this patient by the request  of:    CC:   BJY:NWGNFAOZHYHPI:Subjective  Sheila DupesWendi Salazar is a 51 y.o. female coming in for follow up for back pain. She has been doing well and is able to work out without problems. She said that it feels like something needs to pop in her back and it would feel better. She also notes that she has a bruise on the lateral side of her right quad that will not go away.  Patient states that overall just some mild tightness.      Past Medical History:  Diagnosis Date  . Allergy    No past surgical history on file. Social History   Social History  . Marital status: Single    Spouse name: N/A  . Number of children: N/A  . Years of education: N/A   Social History Main Topics  . Smoking status: Never Smoker  . Smokeless tobacco: Never Used  . Alcohol use None  . Drug use: Unknown  . Sexual activity: Not Asked   Other Topics Concern  . None   Social History Narrative  . None   Allergies  Allergen Reactions  . Erythromycin   . Sulfa Antibiotics    Family History  Problem Relation Age of Onset  . Breast cancer Mother      Past medical history, social, surgical and family history all reviewed in electronic medical record.  No pertanent information unless stated regarding to the chief complaint.   Review of Systems: No headache, visual changes, nausea, vomiting, diarrhea, constipation, dizziness, abdominal pain, skin rash, fevers, chills, night sweats, weight loss, swollen lymph nodes, body aches, joint swelling, muscle aches, chest pain, shortness of breath, mood changes.    Objective  Blood pressure 108/80, pulse 84, weight 127 lb (57.6 kg). Systems examined below as of 08/19/17   General: No apparent distress alert and oriented x3 mood and affect normal, dressed appropriately.  HEENT: Pupils equal, extraocular movements  intact  Respiratory: Patient's speak in full sentences and does not appear short of breath  Cardiovascular: No lower extremity edema, non tender, no erythema  Skin: Warm dry intact with no signs of infection or rash on extremities or on axial skeleton.  Abdomen: Soft nontender  Neuro: Cranial nerves II through XII are intact, neurovascularly intact in all extremities with 2+ DTRs and 2+ pulses.  Lymph: No lymphadenopathy of posterior or anterior cervical chain or axillae bilaterally.  Gait normal with good balance and coordination.  MSK:  Non tender with full range of motion and good stability and symmetric strength and tone of shoulders, elbows, wrist, hip, knee and ankles bilaterally.  Back Exam:  Inspection: Mild loss of lordosis Motion: Flexion 40 deg, Extension 20 deg, Side Bending to 35 deg bilaterally,  Rotation to 35 deg bilaterally  SLR laying: Negative  XSLR laying: Negative  Palpable tenderness: Still tender to palpation over the sacroiliac joint. FABER: negative. Sensory change: Gross sensation intact to all lumbar and sacral dermatomes.  Reflexes: 2+ at both patellar tendons, 2+ at achilles tendons, Babinski's downgoing.  Strength at foot  Plantar-flexion: 5/5 Dorsi-flexion: 5/5 Eversion: 5/5 Inversion: 5/5  Leg strength  Quad: 5/5 Hamstring: 5/5 Hip flexor: 5/5 Hip abductors: 5/5  Gait unremarkable.  Osteopathic findings C7 flexed rotated and side bent left T3 extended rotated and side bent right inhaled third rib T6 extended rotated  and side bent left L1 flexed rotated and side bent right Sacrum right on right     Impression and Recommendations:     This case required medical decision making of moderate complexity.      Note: This dictation was prepared with Dragon dictation along with smaller phrase technology. Any transcriptional errors that result from this process are unintentional.

## 2017-08-19 NOTE — Assessment & Plan Note (Signed)
Overall doing relatively well. We discussed which repeat injections in the knee noted but seems to be doing relatively well. We'll continue to monitor. Encourage her to take gabapentin on a more regular basis. See me again in 6 weeks for further evaluation and treatment.

## 2017-08-27 DIAGNOSIS — G43001 Migraine without aura, not intractable, with status migrainosus: Secondary | ICD-10-CM | POA: Diagnosis not present

## 2017-09-02 DIAGNOSIS — M791 Myalgia: Secondary | ICD-10-CM | POA: Diagnosis not present

## 2017-09-02 DIAGNOSIS — M542 Cervicalgia: Secondary | ICD-10-CM | POA: Diagnosis not present

## 2017-09-02 DIAGNOSIS — G518 Other disorders of facial nerve: Secondary | ICD-10-CM | POA: Diagnosis not present

## 2017-09-02 DIAGNOSIS — G43719 Chronic migraine without aura, intractable, without status migrainosus: Secondary | ICD-10-CM | POA: Diagnosis not present

## 2017-09-09 DIAGNOSIS — N939 Abnormal uterine and vaginal bleeding, unspecified: Secondary | ICD-10-CM | POA: Diagnosis not present

## 2017-09-09 DIAGNOSIS — R32 Unspecified urinary incontinence: Secondary | ICD-10-CM | POA: Diagnosis not present

## 2017-09-22 DIAGNOSIS — N951 Menopausal and female climacteric states: Secondary | ICD-10-CM | POA: Diagnosis not present

## 2017-09-22 DIAGNOSIS — N950 Postmenopausal bleeding: Secondary | ICD-10-CM | POA: Diagnosis not present

## 2017-09-30 ENCOUNTER — Encounter: Payer: Self-pay | Admitting: Family Medicine

## 2017-09-30 ENCOUNTER — Ambulatory Visit (INDEPENDENT_AMBULATORY_CARE_PROVIDER_SITE_OTHER): Payer: BLUE CROSS/BLUE SHIELD | Admitting: Family Medicine

## 2017-09-30 VITALS — BP 100/70 | HR 75 | Ht 60.0 in | Wt 126.0 lb

## 2017-09-30 DIAGNOSIS — M999 Biomechanical lesion, unspecified: Secondary | ICD-10-CM

## 2017-09-30 DIAGNOSIS — M533 Sacrococcygeal disorders, not elsewhere classified: Secondary | ICD-10-CM | POA: Diagnosis not present

## 2017-09-30 NOTE — Progress Notes (Signed)
[name] [address] [place], [place] [address] Phone: [phone] Subjective:     CC: Low back pain  BJY:NWGNFAOZHY  Sheila Salazar is a 51 y.o. female coming in with complaint of low back pain. Patient describes the pain as a dull, throbbing aching pain. Has been found have a sacroiliac dysfunction previous. Has responded well to a supine manipulation. Staying very active. Feeling like she is making significant progress. Patient is working out more on a regular basis at this time.     Past Medical History:  Diagnosis Date  . Allergy    No past surgical history on file. Social History   Social History  . Marital status: Single    Spouse name: N/A  . Number of children: N/A  . Years of education: N/A   Social History Main Topics  . Smoking status: Never Smoker  . Smokeless tobacco: Never Used  . Alcohol use None  . Drug use: Unknown  . Sexual activity: Not Asked   Other Topics Concern  . None   Social History Narrative  . None   Allergies  Allergen Reactions  . Erythromycin   . Sulfa Antibiotics    Family History  Problem Relation Age of Onset  . Breast cancer Mother      Past medical history, social, surgical and family history all reviewed in electronic medical record.  No pertanent information unless stated regarding to the chief complaint.   Review of Systems:Review of systems updated and as accurate as of 09/30/17  No headache, visual changes, nausea, vomiting, diarrhea, constipation, dizziness, abdominal pain, skin rash, fevers, chills, night sweats, weight loss, swollen lymph nodes, body aches, joint swelling, muscle aches, chest pain, shortness of breath, mood changes.   Objective  Blood pressure 100/70, pulse 75, height 5' (1.524 m), weight 126 lb (57.2 kg), SpO2 97 %. Systems examined below as of 09/30/17   General: No apparent distress alert and oriented x3 mood and affect normal, dressed appropriately.  HEENT:  Pupils equal, extraocular movements intact  Respiratory: Patient's speak in full sentences and does not appear short of breath  Cardiovascular: No lower extremity edema, non tender, no erythema  Skin: Warm dry intact with no signs of infection or rash on extremities or on axial skeleton.  Abdomen: Soft nontender  Neuro: Cranial nerves II through XII are intact, neurovascularly intact in all extremities with 2+ DTRs and 2+ pulses.  Lymph: No lymphadenopathy of posterior or anterior cervical chain or axillae bilaterally.  Gait normal with good balance and coordination.  MSK:  Non tender with full range of motion and good stability and symmetric strength and tone of shoulders, elbows, wrist, hip, knee and ankles bilaterally.  Back Exam:  Inspection: Mild loss in lordosis Motion: Flexion 45 deg, Extension 25 deg, Side Bending to 35 deg bilaterally,  Rotation to 45 deg bilaterally  SLR laying: Negative  XSLR laying: Negative  Palpable tenderness: Positive right. Pain over the sacral iliac joint FABER: Positive right. Sensory change: Gross sensation intact to all lumbar and sacral dermatomes.  Reflexes: 2+ at both patellar tendons, 2+ at achilles tendons, Babinski's downgoing.  Strength at foot  Plantar-flexion: 5/5 Dorsi-flexion: 5/5 Eversion: 5/5 Inversion: 5/5  Leg strength  Quad: 5/5 Hamstring: 5/5 Hip flexor: 5/5 Hip abductors: 5/5  Gait unremarkable.     Impression and Recommendations:     This case required medical decision making of moderate complexity.      Note: This dictation was prepared  with Dragon dictation along with smaller phrase technology. Any transcriptional errors that result from this process are unintentional.

## 2017-09-30 NOTE — Assessment & Plan Note (Signed)
Decision today to treat with OMT was based on Physical Exam  After verbal consent patient was treated with HVLA, ME, FPR techniques in cervical, thoracic, lumbar and sacral areas  Patient tolerated the procedure well with improvement in symptoms  Patient given exercises, stretches and lifestyle modifications  See medications in patient instructions if given  Patient will follow up in 4-6 weeks 

## 2017-09-30 NOTE — Patient Instructions (Signed)
6 weeks

## 2017-09-30 NOTE — Assessment & Plan Note (Signed)
Patient is going to continue to work on the core strengthening especially the hip abductors. Has responded very well to 6 week intervals at this time. Not having as much neck pain. Patient with continue to be active. Has anti-inflammatories for any breakthrough pain. Follow-up with me again in 6 weeks

## 2017-10-13 DIAGNOSIS — K648 Other hemorrhoids: Secondary | ICD-10-CM | POA: Diagnosis not present

## 2017-10-13 DIAGNOSIS — K633 Ulcer of intestine: Secondary | ICD-10-CM | POA: Diagnosis not present

## 2017-10-13 DIAGNOSIS — K5730 Diverticulosis of large intestine without perforation or abscess without bleeding: Secondary | ICD-10-CM | POA: Diagnosis not present

## 2017-10-13 DIAGNOSIS — K5289 Other specified noninfective gastroenteritis and colitis: Secondary | ICD-10-CM | POA: Diagnosis not present

## 2017-10-13 DIAGNOSIS — D126 Benign neoplasm of colon, unspecified: Secondary | ICD-10-CM | POA: Diagnosis not present

## 2017-10-13 DIAGNOSIS — Z1211 Encounter for screening for malignant neoplasm of colon: Secondary | ICD-10-CM | POA: Diagnosis not present

## 2017-10-15 DIAGNOSIS — G518 Other disorders of facial nerve: Secondary | ICD-10-CM | POA: Diagnosis not present

## 2017-10-15 DIAGNOSIS — M542 Cervicalgia: Secondary | ICD-10-CM | POA: Diagnosis not present

## 2017-10-15 DIAGNOSIS — M791 Myalgia, unspecified site: Secondary | ICD-10-CM | POA: Diagnosis not present

## 2017-10-15 DIAGNOSIS — G43719 Chronic migraine without aura, intractable, without status migrainosus: Secondary | ICD-10-CM | POA: Diagnosis not present

## 2017-10-16 DIAGNOSIS — N939 Abnormal uterine and vaginal bleeding, unspecified: Secondary | ICD-10-CM | POA: Diagnosis not present

## 2017-10-16 DIAGNOSIS — N858 Other specified noninflammatory disorders of uterus: Secondary | ICD-10-CM | POA: Diagnosis not present

## 2017-10-22 DIAGNOSIS — Z30433 Encounter for removal and reinsertion of intrauterine contraceptive device: Secondary | ICD-10-CM | POA: Diagnosis not present

## 2017-10-22 DIAGNOSIS — N879 Dysplasia of cervix uteri, unspecified: Secondary | ICD-10-CM | POA: Diagnosis not present

## 2017-10-22 DIAGNOSIS — N950 Postmenopausal bleeding: Secondary | ICD-10-CM | POA: Diagnosis not present

## 2017-11-11 ENCOUNTER — Ambulatory Visit (INDEPENDENT_AMBULATORY_CARE_PROVIDER_SITE_OTHER): Payer: BLUE CROSS/BLUE SHIELD | Admitting: Family Medicine

## 2017-11-11 ENCOUNTER — Encounter: Payer: Self-pay | Admitting: Family Medicine

## 2017-11-11 VITALS — BP 120/78 | HR 72 | Ht 60.0 in | Wt 122.0 lb

## 2017-11-11 DIAGNOSIS — M533 Sacrococcygeal disorders, not elsewhere classified: Secondary | ICD-10-CM | POA: Diagnosis not present

## 2017-11-11 DIAGNOSIS — K648 Other hemorrhoids: Secondary | ICD-10-CM | POA: Diagnosis not present

## 2017-11-11 DIAGNOSIS — M999 Biomechanical lesion, unspecified: Secondary | ICD-10-CM | POA: Diagnosis not present

## 2017-11-11 NOTE — Progress Notes (Signed)
[name] D.O. [facility] [address] [place], [place] [address] Phone: [phone] Subjective:     CC: Neck and back pain  ION:[id number]:Subjective  Sheila DupesWendi Salazar is a 51 y.o. female coming in with complaint of neck and back pain.  Has seen patient multiple times.  Patient is having some increasing tightness.  Feels like she is somewhat stressed with the holidays coming up.  Patient also having some anxiety about her daughters.  Discusses more tightness.  Not doing the exercises regularly.  Still working out about 3 times a week.       Past Medical History:  Diagnosis Date  . Allergy    No past surgical history on file. Social History   Socioeconomic History  . Marital status: Single    Spouse name: None  . Number of children: None  . Years of education: None  . Highest education level: None  Social Needs  . Financial resource strain: None  . Food insecurity - worry: None  . Food insecurity - inability: None  . Transportation needs - medical: None  . Transportation needs - non-medical: None  Occupational History  . None  Tobacco Use  . Smoking status: Never Smoker  . Smokeless tobacco: Never Used  Substance and Sexual Activity  . Alcohol use: None  . Drug use: None  . Sexual activity: None  Other Topics Concern  . None  Social History Narrative  . None   Allergies  Allergen Reactions  . Erythromycin   . Sulfa Antibiotics    Family History  Problem Relation Age of Onset  . Breast cancer Mother      Past medical history, social, surgical and family history all reviewed in electronic medical record.  No pertanent information unless stated regarding to the chief complaint.   Review of Systems:Review of systems updated and as accurate as of 11/11/17  No headache, visual changes, nausea, vomiting, diarrhea, constipation, dizziness, abdominal pain, skin rash, fevers, chills, night sweats, weight loss, swollen lymph nodes, body aches, joint swelling,  muscle aches, chest pain, shortness of breath, mood changes.   Objective  Blood pressure 120/78, pulse 72, height 5' (1.524 m), weight 122 lb (55.3 kg), SpO2 97 %. Systems examined below as of 11/11/17   General: No apparent distress alert and oriented x3 mood and affect normal, dressed appropriately.  HEENT: Pupils equal, extraocular movements intact  Respiratory: Patient's speak in full sentences and does not appear short of breath  Cardiovascular: No lower extremity edema, non tender, no erythema  Skin: Warm dry intact with no signs of infection or rash on extremities or on axial skeleton.  Abdomen: Soft nontender  Neuro: Cranial nerves II through XII are intact, neurovascularly intact in all extremities with 2+ DTRs and 2+ pulses.  Lymph: No lymphadenopathy of posterior or anterior cervical chain or axillae bilaterally.  Gait normal with good balance and coordination.  MSK:  Non tender with full range of motion and good stability and symmetric strength and tone of shoulders, elbows, wrist, hip, knee and ankles bilaterally.  Neck: Inspection unremarkable. No palpable stepoffs. Negative Spurling's maneuver. Full neck range of motion Grip strength and sensation normal in bilateral hands Strength good C4 to T1 distribution No sensory change to C4 to T1 Negative Hoffman sign bilaterally Reflexes normal Dyskinesia of scapula on left greater then right   Low back shows positive Pearlean BrownieFaber on the left side.  Tenderness over the left sacroiliac joint.  Mild decrease in extension.  Full strength  in lower extremities.  Osteopathic findings C4 flexed rotated and side bent left C6 flexed rotated and side bent left T3 extended rotated and side bent right inhaled third rib T11 extended rotated and side bent left L2 flexed rotated and side bent right Sacrum left on left     Impression and Recommendations:     This case required medical decision making of moderate complexity.      Note:  This dictation was prepared with Dragon dictation along with smaller phrase technology. Any transcriptional errors that result from this process are unintentional.

## 2017-11-11 NOTE — Assessment & Plan Note (Signed)
Decision today to treat with OMT was based on Physical Exam  After verbal consent patient was treated with HVLA, ME, FPR techniques in cervical, thoracic, rib,  lumbar and sacral areas  Patient tolerated the procedure well with improvement in symptoms  Patient given exercises, stretches and lifestyle modifications  See medications in patient instructions if given  Patient will follow up in 4-8 weeks 

## 2017-11-11 NOTE — Assessment & Plan Note (Signed)
He has done relatively well.  We will continue to monitor.  We discussed again about the importance of core strength and stability including hip abductor strengthening.  Discussed posture and ergonomics throughout the day.  Follow-up again 4-8 weeks

## 2017-11-11 NOTE — Patient Instructions (Signed)
4 ish week

## 2017-11-26 DIAGNOSIS — R350 Frequency of micturition: Secondary | ICD-10-CM | POA: Diagnosis not present

## 2017-11-26 DIAGNOSIS — R351 Nocturia: Secondary | ICD-10-CM | POA: Diagnosis not present

## 2017-11-26 DIAGNOSIS — N393 Stress incontinence (female) (male): Secondary | ICD-10-CM | POA: Diagnosis not present

## 2017-12-02 DIAGNOSIS — M791 Myalgia, unspecified site: Secondary | ICD-10-CM | POA: Diagnosis not present

## 2017-12-02 DIAGNOSIS — G518 Other disorders of facial nerve: Secondary | ICD-10-CM | POA: Diagnosis not present

## 2017-12-02 DIAGNOSIS — M542 Cervicalgia: Secondary | ICD-10-CM | POA: Diagnosis not present

## 2017-12-02 DIAGNOSIS — G43719 Chronic migraine without aura, intractable, without status migrainosus: Secondary | ICD-10-CM | POA: Diagnosis not present

## 2017-12-08 NOTE — Progress Notes (Signed)
Sheila ScaleZach Jawaan Salazar D.O. [facility] [address] [place], [place] [address] Phone: [phone] Subjective:    I'm seeing this patient by the request  of:    CC: Back pain follow-up  XBM:[id number]:Subjective  Sheila DupesWendi Salazar is a 51 y.o. female coming in for follow up for back pain. She said that she feels like her back is in knots.  Patient states that he continues to have tightness overall.  Patient describes the pain as a dull, throbbing aching sensation.  Patient denies of any numbness going down the extremities.     Past Medical History:  Diagnosis Date  . Allergy    No past surgical history on file. Social History   Socioeconomic History  . Marital status: Single    Spouse name: None  . Number of children: None  . Years of education: None  . Highest education level: None  Social Needs  . Financial resource strain: None  . Food insecurity - worry: None  . Food insecurity - inability: None  . Transportation needs - medical: None  . Transportation needs - non-medical: None  Occupational History  . None  Tobacco Use  . Smoking status: Never Smoker  . Smokeless tobacco: Never Used  Substance and Sexual Activity  . Alcohol use: None  . Drug use: None  . Sexual activity: None  Other Topics Concern  . None  Social History Narrative  . None   Allergies  Allergen Reactions  . Erythromycin   . Sulfa Antibiotics    Family History  Problem Relation Age of Onset  . Breast cancer Mother      Past medical history, social, surgical and family history all reviewed in electronic medical record.  No pertanent information unless stated regarding to the chief complaint.   Review of Systems:Review of systems updated and as accurate as of 12/09/17  No headache, visual changes, nausea, vomiting, diarrhea, constipation, dizziness, abdominal pain, skin rash, fevers, chills, night sweats, weight loss, swollen lymph nodes, body aches, joint swelling,chest pain, shortness of breath,  mood changes.  Positive muscle aches  Objective  Blood pressure 118/82, pulse 80, weight 124 lb (56.2 kg), SpO2 97 %. Systems examined below as of 12/09/17   General: No apparent distress alert and oriented x3 mood and affect normal, dressed appropriately.  HEENT: Pupils equal, extraocular movements intact  Respiratory: Patient's speak in full sentences and does not appear short of breath  Cardiovascular: No lower extremity edema, non tender, no erythema  Skin: Warm dry intact with no signs of infection or rash on extremities or on axial skeleton.  Abdomen: Soft nontender  Neuro: Cranial nerves II through XII are intact, neurovascularly intact in all extremities with 2+ DTRs and 2+ pulses.  Lymph: No lymphadenopathy of posterior or anterior cervical chain or axillae bilaterally.  Gait normal with good balance and coordination.  MSK:  Non tender with full range of motion and good stability and symmetric strength and tone of shoulders, elbows, wrist, hip, knee and ankles bilaterally.  Back Exam:  Inspection: Mild loss of lordosis Motion: Flexion 45 deg, Extension 25 deg, Side Bending to 45 deg bilaterally,  Rotation to 45 deg bilaterally  SLR laying: Negative  XSLR laying: Negative  Palpable tenderness: To palpation in the paraspinal musculature lumbar spine. FABER: Mild tightness bilaterally. Sensory change: Gross sensation intact to all lumbar and sacral dermatomes.  Reflexes: 2+ at both patellar tendons, 2+ at achilles tendons, Babinski's downgoing.  Strength at foot  Plantar-flexion: 5/5 Dorsi-flexion: 5/5  Eversion: 5/5 Inversion: 5/5  Leg strength  Quad: 5/5 Hamstring: 5/5 Hip flexor: 5/5 Hip abductors: 4/5 but symmetric Gait unremarkable.  Osteopathic findings C2 flexed rotated and side bent right C4 flexed rotated and side bent left C7 flexed rotated and side bent left T3 extended rotated and side bent right inhaled third rib T6 extended rotated and side bent left L2 flexed  rotated and side bent right Sacrum right on right    Impression and Recommendations:     This case required medical decision making of moderate complexity.      Note: This dictation was prepared with Dragon dictation along with smaller phrase technology. Any transcriptional errors that result from this process are unintentional.

## 2017-12-09 ENCOUNTER — Ambulatory Visit: Payer: BLUE CROSS/BLUE SHIELD | Admitting: Family Medicine

## 2017-12-09 ENCOUNTER — Encounter: Payer: Self-pay | Admitting: Family Medicine

## 2017-12-09 VITALS — BP 118/82 | HR 80 | Wt 124.0 lb

## 2017-12-09 DIAGNOSIS — M533 Sacrococcygeal disorders, not elsewhere classified: Secondary | ICD-10-CM

## 2017-12-09 DIAGNOSIS — K648 Other hemorrhoids: Secondary | ICD-10-CM | POA: Diagnosis not present

## 2017-12-09 DIAGNOSIS — M999 Biomechanical lesion, unspecified: Secondary | ICD-10-CM

## 2017-12-09 NOTE — Assessment & Plan Note (Signed)
Sacroiliac dysfunction still noted.  Some mild tightness.  Taking gabapentin intermittently.  We discussed icing regimen and home exercises.  No significant change in medications or treatment options at this time.  Follow-up again in 6-12 months

## 2017-12-09 NOTE — Patient Instructions (Signed)
Good to see you  Happy holidays!  See me when you need me Whatever works with your schedule.

## 2017-12-09 NOTE — Assessment & Plan Note (Signed)
Decision today to treat with OMT was based on Physical Exam  After verbal consent patient was treated with HVLA, ME, FPR techniques in cervical, thoracic, lumbar and sacral areas  Patient tolerated the procedure well with improvement in symptoms  Patient given exercises, stretches and lifestyle modifications  See medications in patient instructions if given  Patient will follow up in 6-12 weeks 

## 2017-12-11 DIAGNOSIS — F902 Attention-deficit hyperactivity disorder, combined type: Secondary | ICD-10-CM | POA: Diagnosis not present

## 2017-12-11 DIAGNOSIS — Z79899 Other long term (current) drug therapy: Secondary | ICD-10-CM | POA: Diagnosis not present

## 2017-12-30 DIAGNOSIS — N393 Stress incontinence (female) (male): Secondary | ICD-10-CM | POA: Diagnosis not present

## 2017-12-30 DIAGNOSIS — R350 Frequency of micturition: Secondary | ICD-10-CM | POA: Diagnosis not present

## 2017-12-30 DIAGNOSIS — R3915 Urgency of urination: Secondary | ICD-10-CM | POA: Diagnosis not present

## 2018-01-01 DIAGNOSIS — N393 Stress incontinence (female) (male): Secondary | ICD-10-CM | POA: Diagnosis not present

## 2018-01-01 DIAGNOSIS — R350 Frequency of micturition: Secondary | ICD-10-CM | POA: Diagnosis not present

## 2018-01-06 ENCOUNTER — Encounter: Payer: Self-pay | Admitting: Family Medicine

## 2018-01-06 ENCOUNTER — Ambulatory Visit: Payer: BLUE CROSS/BLUE SHIELD | Admitting: Family Medicine

## 2018-01-06 VITALS — BP 122/86 | HR 88 | Ht 60.0 in | Wt 124.0 lb

## 2018-01-06 DIAGNOSIS — M533 Sacrococcygeal disorders, not elsewhere classified: Secondary | ICD-10-CM | POA: Diagnosis not present

## 2018-01-06 DIAGNOSIS — M999 Biomechanical lesion, unspecified: Secondary | ICD-10-CM

## 2018-01-06 DIAGNOSIS — Z23 Encounter for immunization: Secondary | ICD-10-CM

## 2018-01-06 NOTE — Patient Instructions (Signed)
osteitis pubis  See me again in 6 weeks

## 2018-01-06 NOTE — Progress Notes (Signed)
Sheila Salazar D.O. [facility] [address] [place], [place] [address] Phone: [phone] Subjective:      CC: Back pain  BJY:NWGNFAOZHYHPI:Subjective  Sheila DupesWendi Salazar is a 52 y.o. female coming in for follow up for back pain. She feels tightness in her hip flexors but overall her back is doing ok.  No new symptoms.  Patient has been working out on a more regular.  Feels that that has been more beneficial.  We discussed icing regimen and home exercises.  Has a new gym at her new apartment.     Past Medical History:  Diagnosis Date  . Allergy    No past surgical history on file. Social History   Socioeconomic History  . Marital status: Single    Spouse name: Not on file  . Number of children: Not on file  . Years of education: Not on file  . Highest education level: Not on file  Social Needs  . Financial resource strain: Not on file  . Food insecurity - worry: Not on file  . Food insecurity - inability: Not on file  . Transportation needs - medical: Not on file  . Transportation needs - non-medical: Not on file  Occupational History  . Not on file  Tobacco Use  . Smoking status: Never Smoker  . Smokeless tobacco: Never Used  Substance and Sexual Activity  . Alcohol use: Not on file  . Drug use: Not on file  . Sexual activity: Not on file  Other Topics Concern  . Not on file  Social History Narrative  . Not on file   Allergies  Allergen Reactions  . Erythromycin   . Sulfa Antibiotics    Family History  Problem Relation Age of Onset  . Breast cancer Mother      Past medical history, social, surgical and family history all reviewed in electronic medical record.  No pertanent information unless stated regarding to the chief complaint.   Review of Systems:Review of systems updated and as accurate as of 01/06/18  No headache, visual changes, nausea, vomiting, diarrhea, constipation, dizziness, abdominal pain, skin rash, fevers, chills, night sweats, weight loss,  swollen lymph nodes, body aches, joint swelling, muscle aches, chest pain, shortness of breath, mood changes.   Objective  Blood pressure 122/86, pulse 88, height 5' (1.524 m), weight 124 lb (56.2 kg), SpO2 98 %. Systems examined below as of 01/06/18   General: No apparent distress alert and oriented x3 mood and affect normal, dressed appropriately.  HEENT: Pupils equal, extraocular movements intact  Respiratory: Patient's speak in full sentences and does not appear short of breath  Cardiovascular: No lower extremity edema, non tender, no erythema  Skin: Warm dry intact with no signs of infection or rash on extremities or on axial skeleton.  Abdomen: Soft nontender  Neuro: Cranial nerves II through XII are intact, neurovascularly intact in all extremities with 2+ DTRs and 2+ pulses.  Lymph: No lymphadenopathy of posterior or anterior cervical chain or axillae bilaterally.  Gait normal with good balance and coordination.  MSK:  Non tender with full range of motion and good stability and symmetric strength and tone of shoulders, elbows, wrist, hip, knee and ankles bilaterally.  Neck shows some mild tightness at the very small right side on the lower side.  Otherwise full range of motion.  Spurling's. Tenderness over the scapular area on the right. Low back pain shows some mild positive Pearlean BrownieFaber on the right side.  Near full range of motion.  Negative straight leg test.  Full range of motion and strength  Osteopathic findings C4 flexed rotated and side bent left C7 flexed rotated and side bent left T3 extended rotated and side bent right inhaled third rib T6 extended rotated and side bent left L1 flexed rotated and side bent right Sacrum right on right     Impression and Recommendations:     This case required medical decision making of moderate complexity.      Note: This dictation was prepared with Dragon dictation along with smaller phrase technology. Any transcriptional errors  that result from this process are unintentional.

## 2018-01-06 NOTE — Assessment & Plan Note (Signed)
Decision today to treat with OMT was based on Physical Exam  After verbal consent patient was treated with HVLA, ME, FPR techniques in cervical, thoracic, lumbar and sacral areas  Patient tolerated the procedure well with improvement in symptoms  Patient given exercises, stretches and lifestyle modifications  See medications in patient instructions if given  Patient will follow up in 4-6 weeks 

## 2018-01-06 NOTE — Assessment & Plan Note (Signed)
Stable.  I think patient is making significant progress overall.  We discussed icing regimen and home exercises.  We discussed which activities doing which wants to avoid.  Increase activity as tolerated over the course the next several days.  Follow-up with me again 4-8 weeks

## 2018-01-18 DIAGNOSIS — G43719 Chronic migraine without aura, intractable, without status migrainosus: Secondary | ICD-10-CM | POA: Diagnosis not present

## 2018-01-18 DIAGNOSIS — M542 Cervicalgia: Secondary | ICD-10-CM | POA: Diagnosis not present

## 2018-01-18 DIAGNOSIS — M791 Myalgia, unspecified site: Secondary | ICD-10-CM | POA: Diagnosis not present

## 2018-01-18 DIAGNOSIS — G518 Other disorders of facial nerve: Secondary | ICD-10-CM | POA: Diagnosis not present

## 2018-01-19 DIAGNOSIS — G43719 Chronic migraine without aura, intractable, without status migrainosus: Secondary | ICD-10-CM | POA: Diagnosis not present

## 2018-02-02 DIAGNOSIS — N393 Stress incontinence (female) (male): Secondary | ICD-10-CM | POA: Diagnosis not present

## 2018-02-03 ENCOUNTER — Ambulatory Visit: Payer: BLUE CROSS/BLUE SHIELD | Admitting: Family Medicine

## 2018-02-03 ENCOUNTER — Encounter: Payer: Self-pay | Admitting: Family Medicine

## 2018-02-03 ENCOUNTER — Other Ambulatory Visit (INDEPENDENT_AMBULATORY_CARE_PROVIDER_SITE_OTHER): Payer: BLUE CROSS/BLUE SHIELD

## 2018-02-03 VITALS — BP 114/80 | HR 73 | Ht 60.0 in | Wt 124.0 lb

## 2018-02-03 DIAGNOSIS — G43719 Chronic migraine without aura, intractable, without status migrainosus: Secondary | ICD-10-CM | POA: Diagnosis not present

## 2018-02-03 DIAGNOSIS — M2550 Pain in unspecified joint: Secondary | ICD-10-CM | POA: Diagnosis not present

## 2018-02-03 DIAGNOSIS — M542 Cervicalgia: Secondary | ICD-10-CM | POA: Diagnosis not present

## 2018-02-03 DIAGNOSIS — G518 Other disorders of facial nerve: Secondary | ICD-10-CM | POA: Diagnosis not present

## 2018-02-03 DIAGNOSIS — M791 Myalgia, unspecified site: Secondary | ICD-10-CM | POA: Diagnosis not present

## 2018-02-03 DIAGNOSIS — M533 Sacrococcygeal disorders, not elsewhere classified: Secondary | ICD-10-CM | POA: Diagnosis not present

## 2018-02-03 DIAGNOSIS — M999 Biomechanical lesion, unspecified: Secondary | ICD-10-CM | POA: Diagnosis not present

## 2018-02-03 LAB — COMPREHENSIVE METABOLIC PANEL
ALBUMIN: 4.6 g/dL (ref 3.5–5.2)
ALT: 12 U/L (ref 0–35)
AST: 16 U/L (ref 0–37)
Alkaline Phosphatase: 50 U/L (ref 39–117)
BUN: 21 mg/dL (ref 6–23)
CALCIUM: 9.5 mg/dL (ref 8.4–10.5)
CHLORIDE: 102 meq/L (ref 96–112)
CO2: 27 meq/L (ref 19–32)
Creatinine, Ser: 0.93 mg/dL (ref 0.40–1.20)
GFR: 67.18 mL/min (ref >60.00)
Glucose, Bld: 89 mg/dL (ref 70–99)
Potassium: 3.9 mEq/L (ref 3.5–5.1)
Sodium: 138 mEq/L (ref 135–145)
Total Bilirubin: 0.4 mg/dL (ref 0.2–1.2)
Total Protein: 7.4 g/dL (ref 6.0–8.3)

## 2018-02-03 LAB — IBC PANEL
IRON: 73 ug/dL (ref 42–145)
Saturation Ratios: 17.6 % — ABNORMAL LOW (ref 20.0–50.0)
Transferrin: 296 mg/dL (ref 212.0–360.0)

## 2018-02-03 LAB — CBC WITH DIFFERENTIAL/PLATELET
BASOS PCT: 0.8 % (ref 0.0–3.0)
Basophils Absolute: 0 10*3/uL (ref 0.0–0.1)
EOS ABS: 0.1 10*3/uL (ref 0.0–0.7)
Eosinophils Relative: 1.4 % (ref 0.0–5.0)
HEMATOCRIT: 41.2 % (ref 36.0–46.0)
HEMOGLOBIN: 14.2 g/dL (ref 12.0–15.0)
LYMPHS PCT: 25.6 % (ref 12.0–46.0)
Lymphs Abs: 1.4 10*3/uL (ref 0.7–4.0)
MCHC: 34.5 g/dL (ref 30.0–36.0)
MCV: 92 fl (ref 78.0–100.0)
Monocytes Absolute: 0.5 10*3/uL (ref 0.1–1.0)
Monocytes Relative: 9.3 % (ref 3.0–12.0)
NEUTROS ABS: 3.5 10*3/uL (ref 1.4–7.7)
Neutrophils Relative %: 62.9 % (ref 43.0–77.0)
PLATELETS: 313 10*3/uL (ref 150.0–400.0)
RBC: 4.48 Mil/uL (ref 3.87–5.11)
RDW: 13.1 % (ref 11.5–15.5)
WBC: 5.5 10*3/uL (ref 4.0–10.5)

## 2018-02-03 LAB — C-REACTIVE PROTEIN: CRP: 1.2 mg/dL (ref 0.5–20.0)

## 2018-02-03 LAB — URIC ACID: Uric Acid, Serum: 4.9 mg/dL (ref 2.4–7.0)

## 2018-02-03 LAB — SEDIMENTATION RATE: Sed Rate: 8 mm/hr (ref 0–30)

## 2018-02-03 LAB — TSH: TSH: 1.57 u[IU]/mL (ref 0.35–4.50)

## 2018-02-03 MED ORDER — KETOROLAC TROMETHAMINE 60 MG/2ML IM SOLN
60.0000 mg | Freq: Once | INTRAMUSCULAR | Status: AC
  Administered 2018-02-03: 60 mg via INTRAMUSCULAR

## 2018-02-03 MED ORDER — METHYLPREDNISOLONE ACETATE 80 MG/ML IJ SUSP
80.0000 mg | Freq: Once | INTRAMUSCULAR | Status: AC
  Administered 2018-02-03: 80 mg via INTRAMUSCULAR

## 2018-02-03 NOTE — Patient Instructions (Addendum)
Good to see you  I am sorry you are hurting 2 injections today to help with the pain  Ice 20 minutes 2 times daily. Usually after activity and before bed. We will get labs and I will write you the results Definitely though see me again in the next 2 weeks

## 2018-02-03 NOTE — Assessment & Plan Note (Signed)
Decision today to treat with OMT was based on Physical Exam  After verbal consent patient was treated with HVLA, ME, FPR techniques in cervical, thoracic, lumbar and sacral areas  Patient tolerated the procedure well with improvement in symptoms  Patient given exercises, stretches and lifestyle modifications  See medications in patient instructions if given  Patient will follow up in 2-4 weeks 

## 2018-02-03 NOTE — Progress Notes (Signed)
[name] [address] [place], [place] [address] Phone: [phone] Subjective:    I'm seeing this patient by the request  of:    CC: Back pain follow-up  BJY:NWGNFAOZHY  Sheila Salazar is a 52 y.o. female coming in with complaint of back pain. Seen multiple times for back pain.  Has had a right sacroiliac joint cyst previously drained.  Feels that this is different.  Having aches and pains everywhere.  Noticing some numbness in her from time to time.  Feels like she does not have any energy.  All new problems.  Seems to be worsening now over the course of time.     Past Medical History:  Diagnosis Date  . Allergy    No past surgical history on file. Social History   Socioeconomic History  . Marital status: Single    Spouse name: Not on file  . Number of children: Not on file  . Years of education: Not on file  . Highest education level: Not on file  Social Needs  . Financial resource strain: Not on file  . Food insecurity - worry: Not on file  . Food insecurity - inability: Not on file  . Transportation needs - medical: Not on file  . Transportation needs - non-medical: Not on file  Occupational History  . Not on file  Tobacco Use  . Smoking status: Never Smoker  . Smokeless tobacco: Never Used  Substance and Sexual Activity  . Alcohol use: Not on file  . Drug use: Not on file  . Sexual activity: Not on file  Other Topics Concern  . Not on file  Social History Narrative  . Not on file   Allergies  Allergen Reactions  . Erythromycin   . Sulfa Antibiotics    Family History  Problem Relation Age of Onset  . Breast cancer Mother      Past medical history, social, surgical and family history all reviewed in electronic medical record.  No pertanent information unless stated regarding to the chief complaint.   Review of Systems:Review of systems updated and as accurate as of 02/03/18  No headache, visual changes, nausea,  vomiting, diarrhea, constipation, dizziness, abdominal pain, skin rash, fevers, chills, night sweats, weight loss, swollen lymph nodes,chest pain, shortness of breath, mood changes.  Positive body aches, muscle aches  Objective  There were no vitals taken for this visit. Systems examined below as of 02/03/18   General: No apparent distress alert and oriented x3 mood and affect normal, dressed appropriately.  HEENT: Pupils equal, extraocular movements intact  Respiratory: Patient's speak in full sentences and does not appear short of breath  Cardiovascular: No lower extremity edema, non tender, no erythema  Skin: Warm dry intact with no signs of infection or rash on extremities or on axial skeleton.  Abdomen: Soft nontender  Neuro: Cranial nerves II through XII are intact, neurovascularly intact in all extremities with 2+ DTRs and 2+ pulses.  Lymph: No lymphadenopathy of posterior or anterior cervical chain or axillae bilaterally.  Gait normal with good balance and coordination.  MSK: Increase tender with full range of motion and good stability and symmetric strength and tone of shoulders, elbows, wrist, hip, knee and ankles bilaterally.  Back Exam:  Inspection: Unremarkable  Motion: Flexion 45 deg, Extension 25 deg, Side Bending to 35 deg bilaterally,  Rotation to 40 deg bilaterally  SLR laying: Negative  XSLR laying: Negative  Palpable tenderness: Positive tenderness in the paraspinal musculature  lumbar spine more than usual.. FABER: negative. Sensory change: Gross sensation intact to all lumbar and sacral dermatomes.  Reflexes: 2+ at both patellar tendons, 2+ at achilles tendons, Babinski's downgoing.  Strength at foot  Plantar-flexion: 5/5 Dorsi-flexion: 5/5 Eversion: 5/5 Inversion: 5/5  Leg strength  Quad: 5/5 Hamstring: 5/5 Hip flexor: 5/5 Hip abductors: 4/5 but symmetric Gait unremarkable.  Osteopathic findings C2 flexed rotated and side bent right C4 flexed rotated and side  bent left C7 flexed rotated and side bent left T3 extended rotated and side bent right inhaled third rib T6 extended rotated and side bent left L2 flexed rotated and side bent right Sacrum right on right     Impression and Recommendations:     This case required medical decision making of moderate complexity.      Note: This dictation was prepared with Dragon dictation along with smaller phrase technology. Any transcriptional errors that result from this process are unintentional.

## 2018-02-03 NOTE — Assessment & Plan Note (Signed)
Significant increase in tenderness today.  Attempted osteopathic manipulation again.  We discussed the possibility of osteopathic manipulation long-term or other type of medications including prednisone which patient declined at this moment.  No new medications at this time.  Discussed icing regimen.  Encourage patient to get laboratory workup for polymyalgia.  Follow-up with me again in 2-3 weeks

## 2018-02-04 LAB — CALCIUM, IONIZED: CALCIUM ION: 5.4 mg/dL (ref 4.8–5.6)

## 2018-02-06 LAB — RHEUMATOID FACTOR: Rhuematoid fact SerPl-aCnc: <14 IU/mL (ref <14)

## 2018-02-06 LAB — ANTI-NUCLEAR AB-TITER (ANA TITER): ANA Titer 1: 1:320 {titer} — ABNORMAL HIGH

## 2018-02-06 LAB — PTH, INTACT AND CALCIUM
CALCIUM: 9.7 mg/dL (ref 8.6–10.4)
PTH: 25 pg/mL (ref 14–64)

## 2018-02-06 LAB — VITAMIN D 1,25 DIHYDROXY
VITAMIN D3 1, 25 (OH): 43 pg/mL
Vitamin D 1, 25 (OH)2 Total: 43 pg/mL (ref 18–72)
Vitamin D2 1, 25 (OH)2: <8 pg/mL

## 2018-02-06 LAB — ANGIOTENSIN CONVERTING ENZYME: Angiotensin-Converting Enzyme: 37 U/L (ref 9–67)

## 2018-02-06 LAB — CYCLIC CITRUL PEPTIDE ANTIBODY, IGG: Cyclic Citrullin Peptide Ab: <16 UNITS

## 2018-02-06 LAB — ANA: Anti Nuclear Antibody(ANA): POSITIVE — AB

## 2018-02-10 ENCOUNTER — Ambulatory Visit: Payer: BLUE CROSS/BLUE SHIELD | Admitting: Family Medicine

## 2018-02-18 ENCOUNTER — Ambulatory Visit: Payer: BLUE CROSS/BLUE SHIELD | Admitting: Family Medicine

## 2018-02-18 DIAGNOSIS — Z0289 Encounter for other administrative examinations: Secondary | ICD-10-CM

## 2018-02-18 NOTE — Progress Notes (Deleted)
  Tawana ScaleZach Nicky Milhouse D.O. [facility] [address] [place], [place] [address] Phone: [phone] Subjective:    I'm seeing this patient by the request  of:    CC: Lower back pain  BJY:NWGNFAOZHYHPI:Subjective  Karna DupesWendi Kruckenberg is a 52 y.o. female coming in with complaint of low back pain and polyarthralgia.  Patient was having worsening symptoms overall but did have laboratory workup.  Found to have a positive ANA with a 1-320 titer.  Patient states     Past Medical History:  Diagnosis Date  . Allergy    No past surgical history on file. Social History   Socioeconomic History  . Marital status: Single    Spouse name: Not on file  . Number of children: Not on file  . Years of education: Not on file  . Highest education level: Not on file  Social Needs  . Financial resource strain: Not on file  . Food insecurity - worry: Not on file  . Food insecurity - inability: Not on file  . Transportation needs - medical: Not on file  . Transportation needs - non-medical: Not on file  Occupational History  . Not on file  Tobacco Use  . Smoking status: Never Smoker  . Smokeless tobacco: Never Used  Substance and Sexual Activity  . Alcohol use: Not on file  . Drug use: Not on file  . Sexual activity: Not on file  Other Topics Concern  . Not on file  Social History Narrative  . Not on file   Allergies  Allergen Reactions  . Erythromycin   . Sulfa Antibiotics    Family History  Problem Relation Age of Onset  . Breast cancer Mother      Past medical history, social, surgical and family history all reviewed in electronic medical record.  No pertanent information unless stated regarding to the chief complaint.   Review of Systems:Review of systems updated and as accurate as of 02/18/18  No headache, visual changes, nausea, vomiting, diarrhea, constipation, dizziness, abdominal pain, skin rash, fevers, chills, night sweats, weight loss, swollen lymph nodes, body aches, joint swelling,  muscle aches, chest pain, shortness of breath, mood changes.   Objective  There were no vitals taken for this visit. Systems examined below as of 02/18/18   General: No apparent distress alert and oriented x3 mood and affect normal, dressed appropriately.  HEENT: Pupils equal, extraocular movements intact  Respiratory: Patient's speak in full sentences and does not appear short of breath  Cardiovascular: No lower extremity edema, non tender, no erythema  Skin: Warm dry intact with no signs of infection or rash on extremities or on axial skeleton.  Abdomen: Soft nontender  Neuro: Cranial nerves II through XII are intact, neurovascularly intact in all extremities with 2+ DTRs and 2+ pulses.  Lymph: No lymphadenopathy of posterior or anterior cervical chain or axillae bilaterally.  Gait normal with good balance and coordination.  MSK:  Non tender with full range of motion and good stability and symmetric strength and tone of shoulders, elbows, wrist, hip, knee and ankles bilaterally.     Impression and Recommendations:     This case required medical decision making of moderate complexity.      Note: This dictation was prepared with Dragon dictation along with smaller phrase technology. Any transcriptional errors that result from this process are unintentional.

## 2018-02-21 NOTE — Progress Notes (Addendum)
[name] [address] [place], [place] [address] Phone: [phone] Subjective:     CC: Low back pain follow-up  [id number]  Sheila Salazar is a 52 y.o. female coming in with complaint of low back pain.  Seems to be worsening over the course of time.  Patient did have laboratory workup for polyarthralgia.  Found to have a positive ANA with 1-320 titer.  Patient does not want to see rheumatology.  States that the back pain does seem to be worsening.  Starting to affect daily activities.  Patient has not been able to be working out on a regular basis.  Patient rates the severity of pain is 7 out of 10.  Even waking her up at night.     Past Medical History:  Diagnosis Date  . Allergy    No past surgical history on file. Social History   Socioeconomic History  . Marital status: Single    Spouse name: None  . Number of children: None  . Years of education: None  . Highest education level: None  Social Needs  . Financial resource strain: None  . Food insecurity - worry: None  . Food insecurity - inability: None  . Transportation needs - medical: None  . Transportation needs - non-medical: None  Occupational History  . None  Tobacco Use  . Smoking status: Never Smoker  . Smokeless tobacco: Never Used  Substance and Sexual Activity  . Alcohol use: None  . Drug use: None  . Sexual activity: None  Other Topics Concern  . None  Social History Narrative  . None   Allergies  Allergen Reactions  . Erythromycin   . Sulfa Antibiotics    Family History  Problem Relation Age of Onset  . Breast cancer Mother      Past medical history, social, surgical and family history all reviewed in electronic medical record.  No pertanent information unless stated regarding to the chief complaint.   Review of Systems:Review of systems updated and as accurate as of 02/22/18  No headache, visual changes, nausea, vomiting, diarrhea, constipation,  dizziness, abdominal pain, skin rash, fevers, chills, night sweats, weight loss, swollen lymph nodes, body aches, joint swelling, muscle aches, chest pain, shortness of breath, mood changes.   Objective  Blood pressure 116/78, pulse 90, height 5' (1.524 m), weight 122 lb (55.3 kg), SpO2 98 %. Systems examined below as of 02/22/18   General: No apparent distress alert and oriented x3 mood and affect normal, dressed appropriately.  HEENT: Pupils equal, extraocular movements intact  Respiratory: Patient's speak in full sentences and does not appear short of breath  Cardiovascular: No lower extremity edema, non tender, no erythema  Skin: Warm dry intact with no signs of infection or rash on extremities or on axial skeleton.  Abdomen: Soft nontender  Neuro: Cranial nerves II through XII are intact, neurovascularly intact in all extremities with 2+ DTRs and 2+ pulses.  Lymph: No lymphadenopathy of posterior or anterior cervical chain or axillae bilaterally.  Gait mild antalgic gait MSK: Mild tender with full range of motion and good stability and symmetric strength and tone of shoulders, elbows, wrist, hip, knee and ankles bilaterally.  Back Exam:  Inspection: Mild loss of lordosis Motion: Flexion 45 deg, Extension 25 deg, Side Bending to 35 deg bilaterally,  Rotation to 35 deg bilaterally  SLR laying: Positive right XSLR laying: Negative  Palpable tenderness: Tender to palpation in the paraspinal musculature lumbar spine left greater than  right. FABER: Positive right. Sensory change: Gross sensation intact to all lumbar and sacral dermatomes.  Reflexes: 2+ at both patellar tendons, 2+ at achilles tendons, Babinski's downgoing.  Strength at foot  4 out of 5 strength on the right compared to the left leg.  This is different than previous exam New finding also of some mild numbness of the posterior aspect of the leg compared to the contralateral side.  Osteopathic findings C2 flexed rotated  and side bent right C4 flexed rotated and side bent left C7 flexed rotated and side bent left T3 extended rotated and side bent right inhaled third rib T9 extended rotated and side bent left L3 flexed rotated and side bent right Sacrum left on lefton left      Impression and Recommendations:     This case required medical decision making of moderate complexity.      Note: This dictation was prepared with Dragon dictation along with smaller phrase technology. Any transcriptional errors that result from this process are unintentional.

## 2018-02-22 ENCOUNTER — Encounter: Payer: Self-pay | Admitting: Family Medicine

## 2018-02-22 ENCOUNTER — Ambulatory Visit: Payer: BLUE CROSS/BLUE SHIELD | Admitting: Family Medicine

## 2018-02-22 VITALS — BP 116/78 | HR 90 | Ht 60.0 in | Wt 122.0 lb

## 2018-02-22 DIAGNOSIS — M5416 Radiculopathy, lumbar region: Secondary | ICD-10-CM

## 2018-02-22 DIAGNOSIS — M999 Biomechanical lesion, unspecified: Secondary | ICD-10-CM | POA: Diagnosis not present

## 2018-02-22 DIAGNOSIS — M533 Sacrococcygeal disorders, not elsewhere classified: Secondary | ICD-10-CM | POA: Diagnosis not present

## 2018-02-22 NOTE — Assessment & Plan Note (Signed)
Patient is having worsening right lumbar radiculopathy.  I do feel that advanced imaging is warranted at this time.  Discussed icing regimen and home exercises.  Discussed which activities of doing which wants to avoid.  Increase activity slowly over the course the next several days.  Follow-up again in 4-6 weeks

## 2018-02-22 NOTE — Patient Instructions (Signed)
We will get mri  Get xray on Friday  I will discuss with you once we have the imaging

## 2018-02-22 NOTE — Assessment & Plan Note (Signed)
Decision today to treat with OMT was based on Physical Exam  After verbal consent patient was treated with HVLA, ME, FPR techniques in cervical, thoracic, lumbar and sacral areas  Patient tolerated the procedure well with improvement in symptoms  Patient given exercises, stretches and lifestyle modifications  See medications in patient instructions if given  Patient will follow up in 4-6 weeks 

## 2018-03-02 DIAGNOSIS — J329 Chronic sinusitis, unspecified: Secondary | ICD-10-CM | POA: Diagnosis not present

## 2018-03-02 DIAGNOSIS — B9689 Other specified bacterial agents as the cause of diseases classified elsewhere: Secondary | ICD-10-CM | POA: Diagnosis not present

## 2018-03-15 ENCOUNTER — Ambulatory Visit
Admission: RE | Admit: 2018-03-15 | Discharge: 2018-03-15 | Disposition: A | Discharge status: Home / Self Care | Payer: BLUE CROSS/BLUE SHIELD | Source: Ambulatory Visit | Attending: Family Medicine | Admitting: Family Medicine

## 2018-03-15 DIAGNOSIS — M533 Sacrococcygeal disorders, not elsewhere classified: Secondary | ICD-10-CM

## 2018-03-15 DIAGNOSIS — M48061 Spinal stenosis, lumbar region without neurogenic claudication: Secondary | ICD-10-CM | POA: Diagnosis not present

## 2018-03-16 DIAGNOSIS — N393 Stress incontinence (female) (male): Secondary | ICD-10-CM | POA: Diagnosis not present

## 2018-03-19 DIAGNOSIS — N393 Stress incontinence (female) (male): Secondary | ICD-10-CM | POA: Diagnosis not present

## 2018-03-31 DIAGNOSIS — G43719 Chronic migraine without aura, intractable, without status migrainosus: Secondary | ICD-10-CM | POA: Diagnosis not present

## 2018-03-31 DIAGNOSIS — M791 Myalgia, unspecified site: Secondary | ICD-10-CM | POA: Diagnosis not present

## 2018-03-31 DIAGNOSIS — M542 Cervicalgia: Secondary | ICD-10-CM | POA: Diagnosis not present

## 2018-03-31 DIAGNOSIS — R350 Frequency of micturition: Secondary | ICD-10-CM | POA: Diagnosis not present

## 2018-03-31 DIAGNOSIS — G518 Other disorders of facial nerve: Secondary | ICD-10-CM | POA: Diagnosis not present

## 2018-03-31 DIAGNOSIS — N393 Stress incontinence (female) (male): Secondary | ICD-10-CM | POA: Diagnosis not present

## 2018-04-02 DIAGNOSIS — Z79899 Other long term (current) drug therapy: Secondary | ICD-10-CM | POA: Diagnosis not present

## 2018-04-02 DIAGNOSIS — F902 Attention-deficit hyperactivity disorder, combined type: Secondary | ICD-10-CM | POA: Diagnosis not present

## 2018-04-27 DIAGNOSIS — Z1231 Encounter for screening mammogram for malignant neoplasm of breast: Secondary | ICD-10-CM | POA: Diagnosis not present

## 2018-04-27 DIAGNOSIS — Z01419 Encounter for gynecological examination (general) (routine) without abnormal findings: Secondary | ICD-10-CM | POA: Diagnosis not present

## 2018-04-27 DIAGNOSIS — Z6823 Body mass index (BMI) 23.0-23.9, adult: Secondary | ICD-10-CM | POA: Diagnosis not present

## 2018-04-28 DIAGNOSIS — G43719 Chronic migraine without aura, intractable, without status migrainosus: Secondary | ICD-10-CM | POA: Diagnosis not present

## 2018-05-05 DIAGNOSIS — M791 Myalgia, unspecified site: Secondary | ICD-10-CM | POA: Diagnosis not present

## 2018-05-05 DIAGNOSIS — M542 Cervicalgia: Secondary | ICD-10-CM | POA: Diagnosis not present

## 2018-05-05 DIAGNOSIS — G518 Other disorders of facial nerve: Secondary | ICD-10-CM | POA: Diagnosis not present

## 2018-05-05 DIAGNOSIS — G43719 Chronic migraine without aura, intractable, without status migrainosus: Secondary | ICD-10-CM | POA: Diagnosis not present

## 2018-06-08 NOTE — Progress Notes (Signed)
[name] [address] [place], [place] [address] Phone: [phone] Subjective:     CC: Back pain and left leg pain  [id number]  Sheila Salazar is a 52 y.o. female coming in with complaint of back and leg pain. She has been having pain for 3 days in the left quad. She was unable to stand up as her leg was weak all day yesterday. She was on her feet a lot this weekend.  Patient states certain movements such as externally rotating the left leg causes more pain that seems to radiate down the anterior thigh.  Patient states also going up and down stairs seems to give her some discomfort.  Has been walking her dogs regularly more often but other than that nothing specific.  Back exam previously has shown that patient does have some mild degenerative disc disease and has had protruding disc.  MRI confirmed this.  Independently visualized by me.     Past Medical History:  Diagnosis Date  . Allergy    No past surgical history on file. Social History   Socioeconomic History  . Marital status: Single    Spouse name: Not on file  . Number of children: Not on file  . Years of education: Not on file  . Highest education level: Not on file  Occupational History  . Not on file  Social Needs  . Financial resource strain: Not on file  . Food insecurity:    Worry: Not on file    Inability: Not on file  . Transportation needs:    Medical: Not on file    Non-medical: Not on file  Tobacco Use  . Smoking status: Never Smoker  . Smokeless tobacco: Never Used  Substance and Sexual Activity  . Alcohol use: Not on file  . Drug use: Not on file  . Sexual activity: Not on file  Lifestyle  . Physical activity:    Days per week: Not on file    Minutes per session: Not on file  . Stress: Not on file  Relationships  . Social connections:    Talks on phone: Not on file    Gets together: Not on file    Attends religious service: Not on file    Active member of  club or organization: Not on file    Attends meetings of clubs or organizations: Not on file    Relationship status: Not on file  Other Topics Concern  . Not on file  Social History Narrative  . Not on file   Allergies  Allergen Reactions  . Erythromycin   . Sulfa Antibiotics    Family History  Problem Relation Age of Onset  . Breast cancer Mother      Past medical history, social, surgical and family history all reviewed in electronic medical record.  No pertanent information unless stated regarding to the chief complaint.   Review of Systems:Review of systems updated and as accurate as of 06/09/18  No headache, visual changes, nausea, vomiting, diarrhea, constipation, dizziness, abdominal pain, skin rash, fevers, chills, night sweats, weight loss, swollen lymph nodes, body aches, joint swelling, muscle aches, chest pain, shortness of breath, mood changes.   Objective  Blood pressure 104/70, pulse 81, height 5' (1.524 m), weight 124 lb (56.2 kg), SpO2 98 %. Systems examined below as of 06/09/18   General: No apparent distress alert and oriented x3 mood and affect normal, dressed appropriately.  HEENT: Pupils equal, extraocular movements intact  Respiratory:  Patient's speak in full sentences and does not appear short of breath  Cardiovascular: No lower extremity edema, non tender, no erythema  Skin: Warm dry intact with no signs of infection or rash on extremities or on axial skeleton.  Abdomen: Soft nontender  Neuro: Cranial nerves II through XII are intact, neurovascularly intact in all extremities with 2+ DTRs and 2+ pulses.  Lymph: No lymphadenopathy of posterior or anterior cervical chain or axillae bilaterally.  Gait normal with good balance and coordination.  MSK:  Non tender with full range of motion and good stability and symmetric strength and tone of shoulders, elbows, wrist, hip, knee and ankles bilaterally.  Back Exam:  Inspection: Unremarkable  Motion: Flexion 45  deg, Extension 25 deg, Side Bending to 35 deg bilaterally,  Rotation to 45 deg bilaterally  SLR laying: Negative  XSLR laying: Negative  Palpable tenderness: Tender to palpation in the paraspinal musculature lumbar spine. FABER: Positive on the left. Sensory change: Gross sensation intact to all lumbar and sacral dermatomes.  Reflexes: 2+ at both patellar tendons, 2+ at achilles tendons, Babinski's downgoing.  Strength at foot  Plantar-flexion: 5/5 Dorsi-flexion: 5/5 Eversion: 5/5 Inversion: 5/5  Leg strength  Quad: 5/5 Hamstring: 5/5 Hip flexor: 5/5 Hip abductors: 4/5 but symmetric pain over the left quadricep with flexion. Gait unremarkable.  Osteopathic findings  C6 flexed rotated and side bent left T3 extended rotated and side bent right inhaled third rib T9 extended rotated and side bent left L2 flexed rotated and side bent right Sacrum right on right    Impression and Recommendations:     This case required medical decision making of moderate complexity.      Note: This dictation was prepared with Dragon dictation along with smaller phrase technology. Any transcriptional errors that result from this process are unintentional.

## 2018-06-09 ENCOUNTER — Encounter: Payer: Self-pay | Admitting: Family Medicine

## 2018-06-09 ENCOUNTER — Ambulatory Visit: Payer: BLUE CROSS/BLUE SHIELD | Admitting: Family Medicine

## 2018-06-09 VITALS — BP 104/70 | HR 81 | Ht 60.0 in | Wt 124.0 lb

## 2018-06-09 DIAGNOSIS — M999 Biomechanical lesion, unspecified: Secondary | ICD-10-CM | POA: Diagnosis not present

## 2018-06-09 DIAGNOSIS — M5416 Radiculopathy, lumbar region: Secondary | ICD-10-CM | POA: Diagnosis not present

## 2018-06-09 NOTE — Patient Instructions (Addendum)
Good to see you  Sheila Salazar is your friend. Ice 20 minutes 2 times daily. Usually after activity and before bed. Epidural ordered call [phone] and call them and schedule.  New exercises for the quad  Wear compression daily  See me again in 4 weeks

## 2018-06-09 NOTE — Assessment & Plan Note (Signed)
History of an L3 nerve root impingement as well as an L5-S1 protruding disc.  I do believe with patient worsening pain that I do think an epidural would be beneficial.  Discussed with patient in great length.  Discussed icing regimen, home exercises, patient will follow-up 2 weeks after the epidural for further evaluation and treatment.

## 2018-06-09 NOTE — Assessment & Plan Note (Signed)
Decision today to treat with OMT was based on Physical Exam  After verbal consent patient was treated with HVLA, ME, FPR techniques in cervical, thoracic, lumbar and sacral areas  Patient tolerated the procedure well with improvement in symptoms  Patient given exercises, stretches and lifestyle modifications  See medications in patient instructions if given  Patient will follow up in 4 weeks 

## 2018-06-14 ENCOUNTER — Telehealth: Payer: Self-pay | Admitting: Family Medicine

## 2018-06-14 NOTE — Telephone Encounter (Signed)
Copied from CRM [id number]. Topic: Quick Communication - See Telephone Encounter >> Jun 14, 2018 10:48 AM Jolayne Hainesaylor, Brittany L wrote: CRM for notification. See Telephone encounter for: 06/14/18.  Patient said that Dr Katrinka BlazingSmith put in for her to have a epidural in her back. She said it has been about three days and nobody has called to schedule that for her. Please advise..  Call back @ [phone]

## 2018-06-14 NOTE — Telephone Encounter (Signed)
Pt given [facility] phone number

## 2018-06-16 DIAGNOSIS — M542 Cervicalgia: Secondary | ICD-10-CM | POA: Diagnosis not present

## 2018-06-16 DIAGNOSIS — G43719 Chronic migraine without aura, intractable, without status migrainosus: Secondary | ICD-10-CM | POA: Diagnosis not present

## 2018-06-16 DIAGNOSIS — R350 Frequency of micturition: Secondary | ICD-10-CM | POA: Diagnosis not present

## 2018-06-16 DIAGNOSIS — G518 Other disorders of facial nerve: Secondary | ICD-10-CM | POA: Diagnosis not present

## 2018-06-16 DIAGNOSIS — M791 Myalgia, unspecified site: Secondary | ICD-10-CM | POA: Diagnosis not present

## 2018-06-16 DIAGNOSIS — N393 Stress incontinence (female) (male): Secondary | ICD-10-CM | POA: Diagnosis not present

## 2018-06-16 IMAGING — MR MR LUMBAR SPINE W/O CM
4 of 5 series · 25 of 48 positions shown · non-contrast
Comparison: Prior radiograph from 02/13/2015.

CLINICAL DATA: Initial evaluation for low back pain radiating into
the lower extremities bilaterally, right worse than left.

EXAM:
MRI LUMBAR SPINE WITHOUT CONTRAST
TECHNIQUE: Multiplanar, multisequence MR imaging of the lumbar spine was
performed. No intravenous contrast was administered.

[Series 3: T2 post-contrast · sagittal · 4.0mm · 0.55mm/px · 6 of 13 slices shown]
[im 1/13]
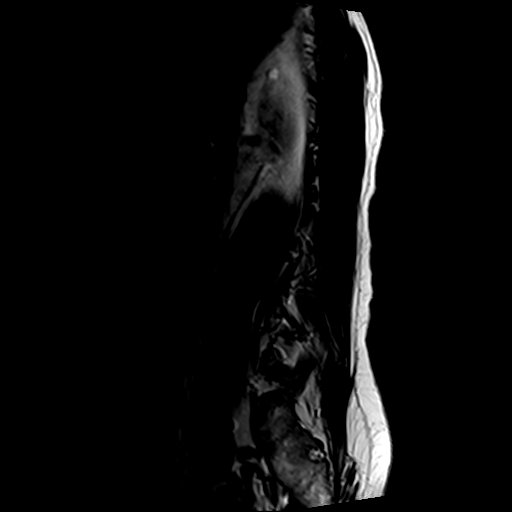
[im 3/13]
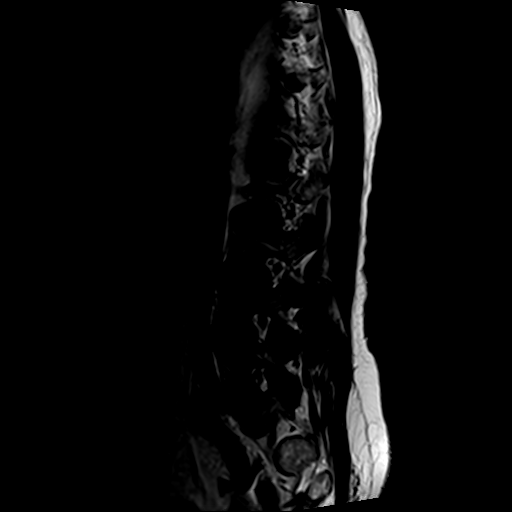
[im 5/13]
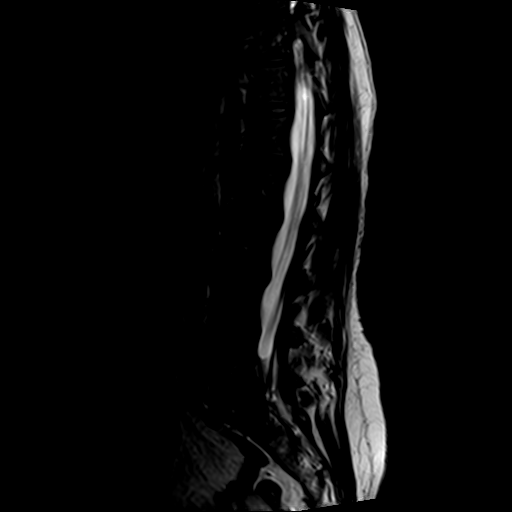
[im 8/13]
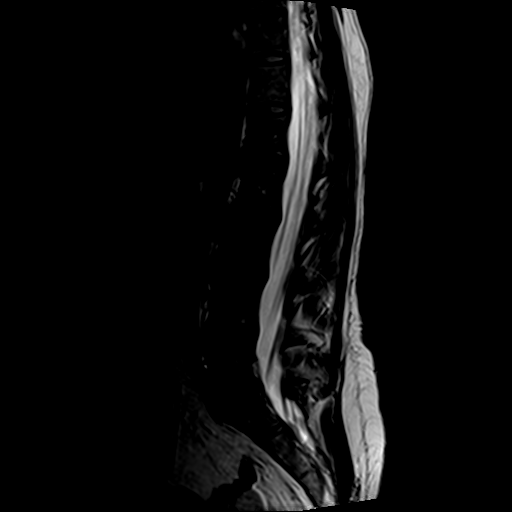
[im 10/13]
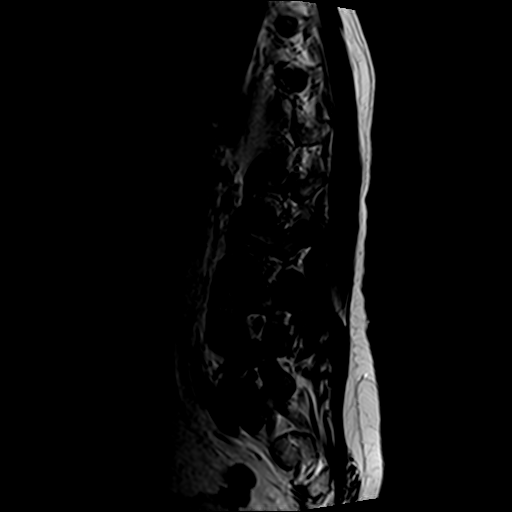
[im 13/13]
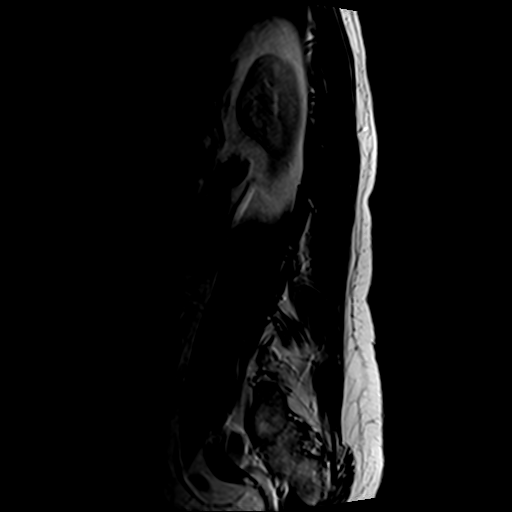

[Series 5: T1 · sagittal · 4.0mm · 0.55mm/px · 6 of 13 slices shown (1 of 2)]
[im 1/13]
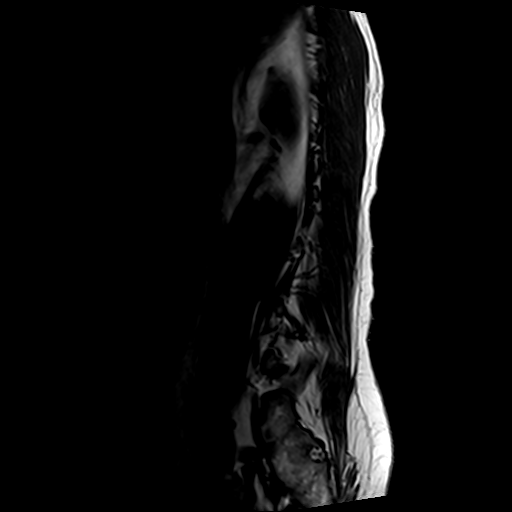
[im 3/13]
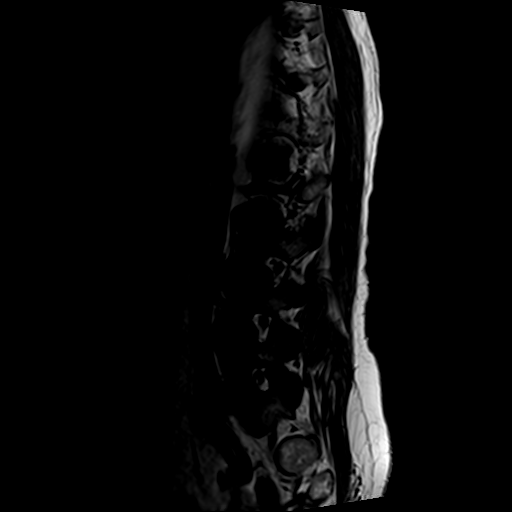
[im 5/13]
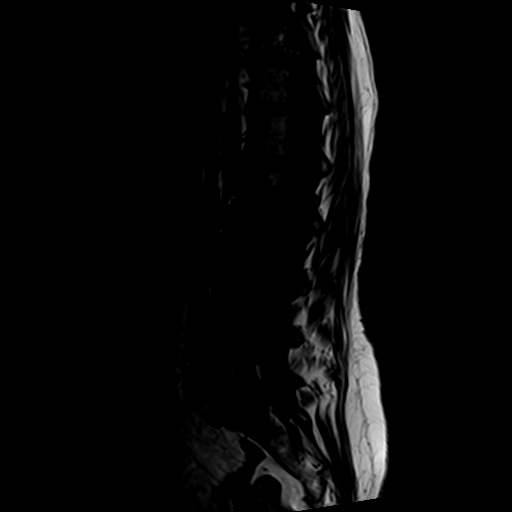
[im 8/13]
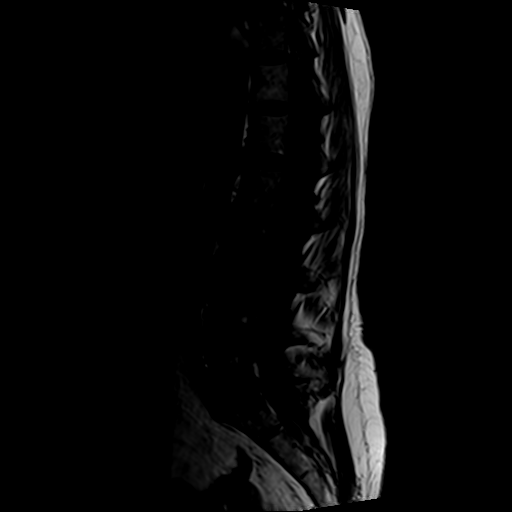
[im 10/13]
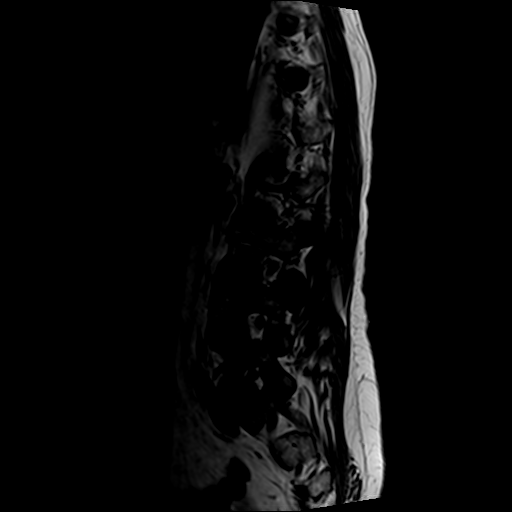
[im 13/13]
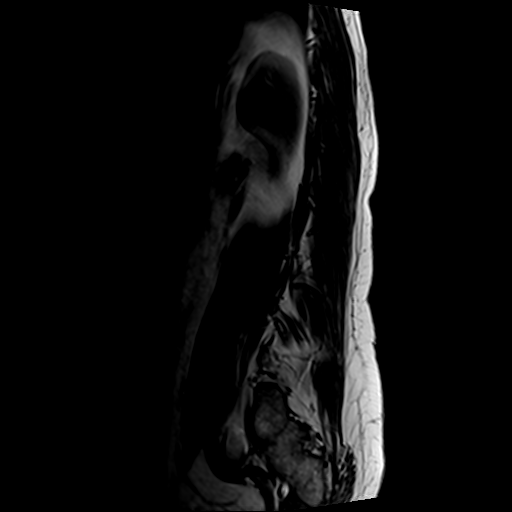

[Series 6: T1 · axial · 4.0mm · 0.35mm/px · z∈[-88,+80]mm · 4 of 34 slices shown (2 of 2)]
[im 1/34]
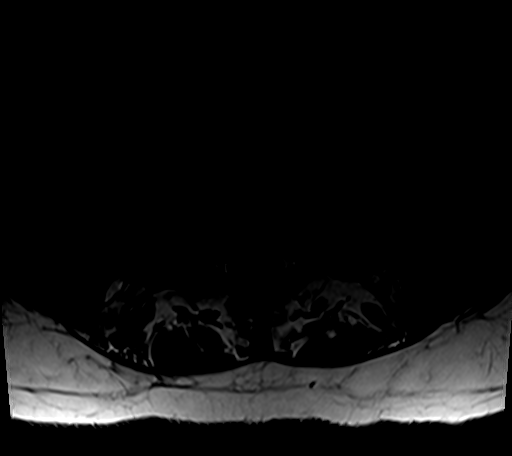
[im 5/34]
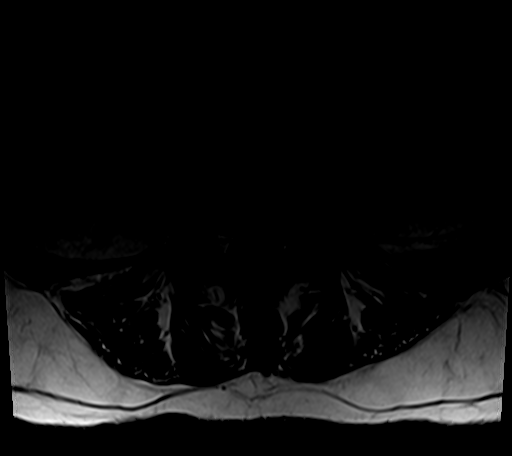
[im 17/34]
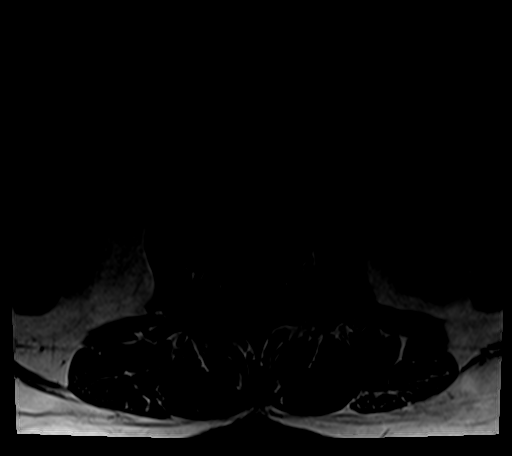
[im 29/34]
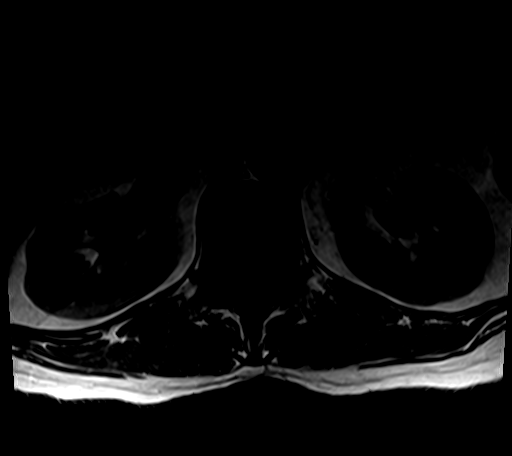

[Series 7: T2 · axial · 4.0mm · 0.70mm/px · z∈[-88,+106]mm · 9 of 34 slices shown]
[im 1/34]
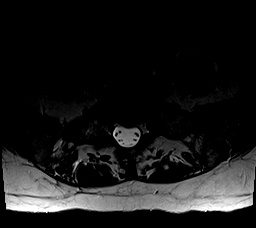
[im 5/34]
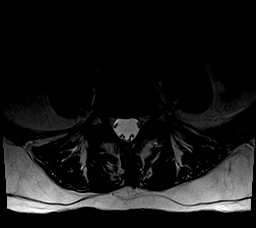
[im 10/34]
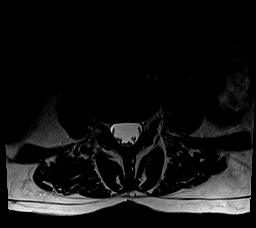
[im 15/34]
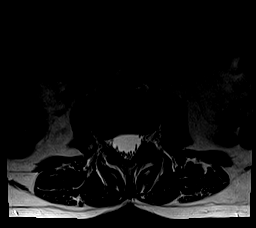
[im 17/34]
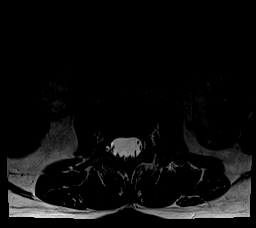
[im 19/34]
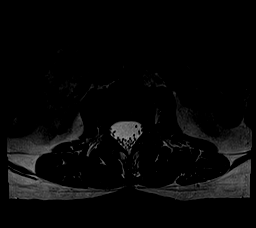
[im 24/34]
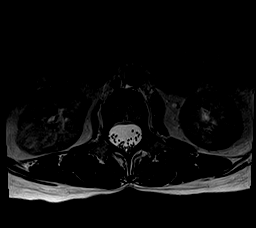
[im 29/34]
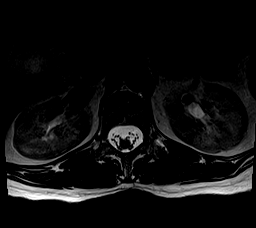
[im 34/34]
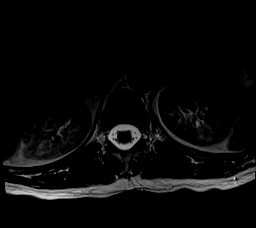

[25 of 48 positions shown; findings below may reference images not displayed]

FINDINGS: Segmentation: Normal segmentation. Lowest well-formed disc labeled
the L5-S1 level.

Alignment: Straightening with slight reversal of the normal lumbar
lordosis. No listhesis.

Vertebrae: Vertebral body heights are maintained without evidence
for acute or chronic fracture. Bone marrow signal intensity within
normal limits. No discrete or worrisome osseous lesions. No abnormal
marrow edema.

Conus medullaris and cauda equina: Conus extends to the L1-2 level.
Conus and cauda equina appear normal.

Paraspinal and other soft tissues: Paraspinous soft tissues within
normal limits. Few subcentimeter T2 hyperintense cyst noted within
the left kidney. Visualized visceral structures otherwise
unremarkable.

Disc levels:

L1-2:  Unremarkable.

L2-3:  Unremarkable.

L3-4: Shallow right foraminal disc protrusion with associated
annular fissure (series 5, image 3). Protruding disc closely
approximates the exiting right L3 nerve root without neural
impingement. Mild right foraminal narrowing. No significant canal or
foraminal stenosis.

L4-5: Normal interspace. Mild facet and ligament flavum hypertrophy.
No canal or foraminal stenosis.

L5-S1: Mild diffuse disc bulge with disc desiccation. Superimposed
tiny central disc protrusion with associated annular fissure. Mild
facet and ligament flavum hypertrophy. No significant canal
stenosis. Mild bilateral L5 foraminal narrowing, left slightly
greater than right.
IMPRESSION: 1. Small right foraminal disc protrusion at L3-4, closely
approximating and potentially irritating the exiting right L3 nerve
root.
2. Mild bilateral L5 foraminal narrowing related to disc bulge and
facet hypertrophy.
3. Small central disc protrusion at L5-S1 without stenosis or
impingement.

## 2018-06-22 ENCOUNTER — Inpatient Hospital Stay
Admission: RE | Admit: 2018-06-22 | Discharge: 2018-06-22 | Disposition: A | Discharge status: Home / Self Care | Payer: BLUE CROSS/BLUE SHIELD | Source: Ambulatory Visit | Attending: Family Medicine | Admitting: Family Medicine

## 2018-06-22 NOTE — Discharge Instructions (Signed)

## 2018-06-30 DIAGNOSIS — F902 Attention-deficit hyperactivity disorder, combined type: Secondary | ICD-10-CM | POA: Diagnosis not present

## 2018-06-30 DIAGNOSIS — Z79899 Other long term (current) drug therapy: Secondary | ICD-10-CM | POA: Diagnosis not present

## 2018-07-15 ENCOUNTER — Other Ambulatory Visit: Payer: BLUE CROSS/BLUE SHIELD

## 2018-07-15 ENCOUNTER — Ambulatory Visit
Admission: RE | Admit: 2018-07-15 | Discharge: 2018-07-15 | Disposition: A | Discharge status: Home / Self Care | Payer: BLUE CROSS/BLUE SHIELD | Source: Ambulatory Visit | Attending: Family Medicine | Admitting: Family Medicine

## 2018-07-15 DIAGNOSIS — M5416 Radiculopathy, lumbar region: Secondary | ICD-10-CM

## 2018-07-15 DIAGNOSIS — M47817 Spondylosis without myelopathy or radiculopathy, lumbosacral region: Secondary | ICD-10-CM | POA: Diagnosis not present

## 2018-07-15 MED ORDER — METHYLPREDNISOLONE ACETATE 40 MG/ML INJ SUSP (RADIOLOG
120.0000 mg | Freq: Once | INTRAMUSCULAR | Status: AC
  Administered 2018-07-15: 120 mg via EPIDURAL

## 2018-07-15 MED ORDER — IOPAMIDOL (ISOVUE-M 200) INJECTION 41%
1.0000 mL | Freq: Once | INTRAMUSCULAR | Status: AC
  Administered 2018-07-15: 1 mL via EPIDURAL

## 2018-07-15 NOTE — Discharge Instructions (Signed)

## 2018-07-16 DIAGNOSIS — G43719 Chronic migraine without aura, intractable, without status migrainosus: Secondary | ICD-10-CM | POA: Diagnosis not present

## 2018-07-26 NOTE — Progress Notes (Signed)
[facility] [address] [place], [place] [address] Phone: [phone] Subjective:    I'm seeing this patient by the request  of:    CC: Back pain follow-up  BJY:NWGNFAOZHYHPI:Subjective  Karna DupesWendi Cordial is a 52 y.o. female coming in with complaint of back pain. States the back is doing a lot better since the epidural.   Patient had the epidural 2 weeks ago.  States that she is feeling 80% better.  States that she has noticed increasing range of motion as well.  Happy with the results so far.     Past Medical History:  Diagnosis Date  . Allergy    No past surgical history on file. Social History   Socioeconomic History  . Marital status: Single    Spouse name: Not on file  . Number of children: Not on file  . Years of education: Not on file  . Highest education level: Not on file  Occupational History  . Not on file  Social Needs  . Financial resource strain: Not on file  . Food insecurity:    Worry: Not on file    Inability: Not on file  . Transportation needs:    Medical: Not on file    Non-medical: Not on file  Tobacco Use  . Smoking status: Never Smoker  . Smokeless tobacco: Never Used  Substance and Sexual Activity  . Alcohol use: Not on file  . Drug use: Not on file  . Sexual activity: Not on file  Lifestyle  . Physical activity:    Days per week: Not on file    Minutes per session: Not on file  . Stress: Not on file  Relationships  . Social connections:    Talks on phone: Not on file    Gets together: Not on file    Attends religious service: Not on file    Active member of club or organization: Not on file    Attends meetings of clubs or organizations: Not on file    Relationship status: Not on file  Other Topics Concern  . Not on file  Social History Narrative  . Not on file   Allergies  Allergen Reactions  . Erythromycin   . Sulfa Antibiotics    Family History  Problem Relation Age of Onset  . Breast cancer Mother       Past medical history, social, surgical and family history all reviewed in electronic medical record.  No pertanent information unless stated regarding to the chief complaint.   Review of Systems:Review of systems updated and as accurate as of 07/27/18  No headache, visual changes, nausea, vomiting, diarrhea, constipation, dizziness, abdominal pain, skin rash, fevers, chills, night sweats, weight loss, swollen lymph nodes, body aches, joint swelling,  chest pain, shortness of breath, mood changes.  Positive muscle aches  Objective  Blood pressure 100/80, pulse 71, height 5' (1.524 m), weight 125 lb (56.7 kg), SpO2 98 %. Systems examined below as of 07/27/18   General: No apparent distress alert and oriented x3 mood and affect normal, dressed appropriately.  HEENT: Pupils equal, extraocular movements intact  Respiratory: Patient's speak in full sentences and does not appear short of breath  Cardiovascular: No lower extremity edema, non tender, no erythema  Skin: Warm dry intact with no signs of infection or rash on extremities or on axial skeleton.  Abdomen: Soft nontender  Neuro: Cranial nerves II through XII are intact, neurovascularly intact in all extremities with 2+ DTRs and  2+ pulses.  Lymph: No lymphadenopathy of posterior or anterior cervical chain or axillae bilaterally.  Gait normal with good balance and coordination.  MSK:  Non tender with full range of motion and good stability and symmetric strength and tone of shoulders, elbows, wrist, hip, knee and ankles bilaterally.  Back Exam:  Inspection: Mild loss of lordosis Motion: Flexion 45 deg, Extension 25 deg, Side Bending to 35 deg bilaterally,  Rotation to 45 deg bilaterally  SLR laying: Negative  XSLR laying: Negative  Palpable tenderness: Tenderness to palpation the paraspinal musculature lumbar spine right greater than left. FABER: Mild positive on the right. Sensory change: Gross sensation intact to all lumbar and  sacral dermatomes.  Reflexes: 2+ at both patellar tendons, 2+ at achilles tendons, Babinski's downgoing.  Strength at foot  Plantar-flexion: 5/5 Dorsi-flexion: 5/5 Eversion: 5/5 Inversion: 5/5  Leg strength  Quad: 5/5 Hamstring: 5/5 Hip flexor: 5/5 Hip abductors: 5/5  Gait unremarkable.  Osteopathic findings C7 flexed rotated and side bent left T3 extended rotated and side bent right inhaled third rib T9 extended rotated and side bent left L3 flexed rotated and side bent right Sacrum right on right     Impression and Recommendations:     This case required medical decision making of moderate complexity.      Note: This dictation was prepared with Dragon dictation along with smaller phrase technology. Any transcriptional errors that result from this process are unintentional.

## 2018-07-27 ENCOUNTER — Encounter: Payer: Self-pay | Admitting: Family Medicine

## 2018-07-27 ENCOUNTER — Ambulatory Visit: Payer: BLUE CROSS/BLUE SHIELD | Admitting: Family Medicine

## 2018-07-27 VITALS — BP 100/80 | HR 71 | Ht 60.0 in | Wt 125.0 lb

## 2018-07-27 DIAGNOSIS — G8929 Other chronic pain: Secondary | ICD-10-CM | POA: Diagnosis not present

## 2018-07-27 DIAGNOSIS — M545 Low back pain: Secondary | ICD-10-CM

## 2018-07-27 DIAGNOSIS — M5416 Radiculopathy, lumbar region: Secondary | ICD-10-CM

## 2018-07-27 DIAGNOSIS — M999 Biomechanical lesion, unspecified: Secondary | ICD-10-CM

## 2018-07-27 NOTE — Assessment & Plan Note (Signed)
Decision today to treat with OMT was based on Physical Exam  After verbal consent patient was treated with HVLA, ME, FPR techniques in cervical, thoracic, rib lumbar and sacral areas  Patient tolerated the procedure well with improvement in symptoms  Patient given exercises, stretches and lifestyle modifications  See medications in patient instructions if given  Patient will follow up in 4-8 weeks 

## 2018-07-27 NOTE — Assessment & Plan Note (Signed)
Improving after the epidural.  Discussed with patient that we can repeat if necessary.  Order placed today.  Discussed icing regimen and home exercise, discussed posture and ergonomics.  Patient responded well to manipulation again.  Follow-up in 4 to 8 weeks

## 2018-07-27 NOTE — Patient Instructions (Signed)
Great to see you  I am impressed Keep it up  Epidural ordered again if you want  See me again in 6-8 weeks!

## 2018-08-16 ENCOUNTER — Ambulatory Visit
Admission: RE | Admit: 2018-08-16 | Discharge: 2018-08-16 | Disposition: A | Discharge status: Home / Self Care | Payer: BLUE CROSS/BLUE SHIELD | Source: Ambulatory Visit | Attending: Family Medicine | Admitting: Family Medicine

## 2018-08-16 DIAGNOSIS — M545 Low back pain: Principal | ICD-10-CM

## 2018-08-16 DIAGNOSIS — G8929 Other chronic pain: Secondary | ICD-10-CM

## 2018-08-16 DIAGNOSIS — M47817 Spondylosis without myelopathy or radiculopathy, lumbosacral region: Secondary | ICD-10-CM | POA: Diagnosis not present

## 2018-08-16 MED ORDER — METHYLPREDNISOLONE ACETATE 40 MG/ML INJ SUSP (RADIOLOG
120.0000 mg | Freq: Once | INTRAMUSCULAR | Status: AC
  Administered 2018-08-16: 120 mg via EPIDURAL

## 2018-08-16 MED ORDER — IOPAMIDOL (ISOVUE-M 200) INJECTION 41%
1.0000 mL | Freq: Once | INTRAMUSCULAR | Status: AC
  Administered 2018-08-16: 1 mL via EPIDURAL

## 2018-08-19 DIAGNOSIS — G518 Other disorders of facial nerve: Secondary | ICD-10-CM | POA: Diagnosis not present

## 2018-08-19 DIAGNOSIS — M542 Cervicalgia: Secondary | ICD-10-CM | POA: Diagnosis not present

## 2018-08-19 DIAGNOSIS — M791 Myalgia, unspecified site: Secondary | ICD-10-CM | POA: Diagnosis not present

## 2018-08-19 DIAGNOSIS — G43719 Chronic migraine without aura, intractable, without status migrainosus: Secondary | ICD-10-CM | POA: Diagnosis not present

## 2018-08-25 DIAGNOSIS — G4700 Insomnia, unspecified: Secondary | ICD-10-CM | POA: Diagnosis not present

## 2018-08-25 DIAGNOSIS — Z0000 Encounter for general adult medical examination without abnormal findings: Secondary | ICD-10-CM | POA: Diagnosis not present

## 2018-08-25 DIAGNOSIS — G43909 Migraine, unspecified, not intractable, without status migrainosus: Secondary | ICD-10-CM | POA: Diagnosis not present

## 2018-08-25 DIAGNOSIS — I10 Essential (primary) hypertension: Secondary | ICD-10-CM | POA: Diagnosis not present

## 2018-09-07 NOTE — Progress Notes (Signed)
Sheila ScaleZach Aune Salazar D.O. [facility] [address] [place], [place] [address] Phone: [phone] Subjective:    I Sheila NighKana Salazar am serving as a Neurosurgeonscribe for Dr. Antoine PrimasZachary Edynn Salazar.  CC: Back pain follow-up  BJY:[id number]:Subjective  Sheila DupesWendi Salazar is a 52 y.o. female coming in with complaint of back pain. Recently had epidural. States it has helped a lot.  Patient had a another epidural done on the right side L4-L5 on August 16, 2018.  States that she is feeling approximately 90% better.  Still some mild tightness.  Not working out as regularly.  Patient states of that feels better overall.  Happy with the results.      Past Medical History:  Diagnosis Date  . Allergy    No past surgical history on file. Social History   Socioeconomic History  . Marital status: Single    Spouse name: Not on file  . Number of children: Not on file  . Years of education: Not on file  . Highest education level: Not on file  Occupational History  . Not on file  Social Needs  . Financial resource strain: Not on file  . Food insecurity:    Worry: Not on file    Inability: Not on file  . Transportation needs:    Medical: Not on file    Non-medical: Not on file  Tobacco Use  . Smoking status: Never Smoker  . Smokeless tobacco: Never Used  Substance and Sexual Activity  . Alcohol use: Not on file  . Drug use: Not on file  . Sexual activity: Not on file  Lifestyle  . Physical activity:    Days per week: Not on file    Minutes per session: Not on file  . Stress: Not on file  Relationships  . Social connections:    Talks on phone: Not on file    Gets together: Not on file    Attends religious service: Not on file    Active member of club or organization: Not on file    Attends meetings of clubs or organizations: Not on file    Relationship status: Not on file  Other Topics Concern  . Not on file  Social History Narrative  . Not on file   Allergies  Allergen Reactions  . Erythromycin   . Sulfa  Antibiotics    Family History  Problem Relation Age of Onset  . Breast cancer Mother     Current Outpatient Medications (Endocrine & Metabolic):  .  estradiol (ESTRACE) 2 MG tablet, Take 2 mg by mouth daily.  Current Outpatient Medications (Cardiovascular):  .  amLODipine (NORVASC) 10 MG tablet,      Current Outpatient Medications (Other):  .  clotrimazole (MYCELEX) 10 MG troche,  .  divalproex (DEPAKOTE) 250 MG DR tablet, Take 250 mg by mouth 3 (three) times daily. Marland Kitchen.  escitalopram (LEXAPRO) 20 MG tablet,  .  gabapentin (NEURONTIN) 100 MG capsule, TAKE 2 CAPSULES (200 MG TOTAL) BY MOUTH AT BEDTIME. .  Multiple Vitamin (MULTIVITAMIN) tablet, Take 1 tablet by mouth daily. Marland Kitchen.  VYVANSE 30 MG capsule,  .  zolpidem (AMBIEN) 10 MG tablet,     Past medical history, social, surgical and family history all reviewed in electronic medical record.  No pertanent information unless stated regarding to the chief complaint.   Review of Systems:  No headache, visual changes, nausea, vomiting, diarrhea, constipation, dizziness, abdominal pain, skin rash, fevers, chills, night sweats, weight loss, swollen lymph nodes, body aches, joint  swelling, muscle aches, chest pain, shortness of breath, mood changes.   Objective  Blood pressure 110/90, pulse 77, height 5' (1.524 m), weight 126 lb (57.2 kg), SpO2 97 %.   General: No apparent distress alert and oriented x3 mood and affect normal, dressed appropriately.  HEENT: Pupils equal, extraocular movements intact  Respiratory: Patient's speak in full sentences and does not appear short of breath  Cardiovascular: No lower extremity edema, non tender, no erythema  Skin: Warm dry intact with no signs of infection or rash on extremities or on axial skeleton.  Abdomen: Soft nontender  Neuro: Cranial nerves II through XII are intact, neurovascularly intact in all extremities with 2+ DTRs and 2+ pulses.  Lymph: No lymphadenopathy of posterior or anterior  cervical chain or axillae bilaterally.  Gait normal with good balance and coordination.  MSK:  Non tender with full range of motion and good stability and symmetric strength and tone of shoulders, elbows, wrist, hip, knee and ankles bilaterally.  Back Exam:  Inspection: Unremarkable  Motion: Flexion 40 deg, Extension 25 deg, Side Bending to 45 deg bilaterally,  Rotation to 45 deg bilaterally  SLR laying: Negative  XSLR laying: Negative  Palpable tenderness: Tender to palpation in the paraspinal musculature lumbar spine.  Moderately. FABER: negative. Sensory change: Gross sensation intact to all lumbar and sacral dermatomes.  Reflexes: 2+ at both patellar tendons, 2+ at achilles tendons, Babinski's downgoing.  Strength at foot  Plantar-flexion: 5/5 Dorsi-flexion: 5/5 Eversion: 5/5 Inversion: 5/5  Leg strength  Quad: 5/5 Hamstring: 5/5 Hip flexor: 5/5 Hip abductors: 5/5  Gait unremarkable.   Osteopathic findings  C6 flexed rotated and side bent left T3 extended rotated and side bent right inhaled third rib T11 extended rotated and side bent left L4 flexed rotated and side bent right Sacrum right on right    Impression and Recommendations:     This case required medical decision making of moderate complexity. The above documentation has been reviewed and is accurate and complete Sheila Saa, DO       Note: This dictation was prepared with Dragon dictation along with smaller phrase technology. Any transcriptional errors that result from this process are unintentional.

## 2018-09-08 ENCOUNTER — Encounter: Payer: Self-pay | Admitting: Family Medicine

## 2018-09-08 ENCOUNTER — Ambulatory Visit: Payer: BLUE CROSS/BLUE SHIELD | Admitting: Family Medicine

## 2018-09-08 VITALS — BP 110/90 | HR 77 | Ht 60.0 in | Wt 126.0 lb

## 2018-09-08 DIAGNOSIS — M9908 Segmental and somatic dysfunction of rib cage: Secondary | ICD-10-CM

## 2018-09-08 DIAGNOSIS — M9902 Segmental and somatic dysfunction of thoracic region: Secondary | ICD-10-CM | POA: Diagnosis not present

## 2018-09-08 DIAGNOSIS — M5416 Radiculopathy, lumbar region: Secondary | ICD-10-CM

## 2018-09-08 DIAGNOSIS — M9903 Segmental and somatic dysfunction of lumbar region: Secondary | ICD-10-CM | POA: Diagnosis not present

## 2018-09-08 DIAGNOSIS — M9901 Segmental and somatic dysfunction of cervical region: Secondary | ICD-10-CM

## 2018-09-08 DIAGNOSIS — M999 Biomechanical lesion, unspecified: Secondary | ICD-10-CM | POA: Diagnosis not present

## 2018-09-08 DIAGNOSIS — M9904 Segmental and somatic dysfunction of sacral region: Secondary | ICD-10-CM

## 2018-09-08 NOTE — Patient Instructions (Signed)
6-12 weeks 

## 2018-09-08 NOTE — Assessment & Plan Note (Signed)
Doing much better after the injection.  Discussed icing regimen and home exercise.  Discussed which activities to do which wants to avoid.  Patient was given a return to progression exercises.  Follow-up again in 4 to 8 weeks

## 2018-09-08 NOTE — Assessment & Plan Note (Signed)
Decision today to treat with OMT was based on Physical Exam  After verbal consent patient was treated with HVLA, ME, FPR techniques in cervical, thoracic, rib,  lumbar and sacral areas  Patient tolerated the procedure well with improvement in symptoms  Patient given exercises, stretches and lifestyle modifications  See medications in patient instructions if given  Patient will follow up in 4-8 weeks 

## 2018-09-14 DIAGNOSIS — M2022 Hallux rigidus, left foot: Secondary | ICD-10-CM | POA: Diagnosis not present

## 2018-09-14 DIAGNOSIS — M79672 Pain in left foot: Secondary | ICD-10-CM | POA: Diagnosis not present

## 2018-09-22 DIAGNOSIS — Z79899 Other long term (current) drug therapy: Secondary | ICD-10-CM | POA: Diagnosis not present

## 2018-09-22 DIAGNOSIS — F902 Attention-deficit hyperactivity disorder, combined type: Secondary | ICD-10-CM | POA: Diagnosis not present

## 2018-10-20 ENCOUNTER — Ambulatory Visit: Payer: BLUE CROSS/BLUE SHIELD | Admitting: Family Medicine

## 2018-10-20 DIAGNOSIS — Z79899 Other long term (current) drug therapy: Secondary | ICD-10-CM | POA: Diagnosis not present

## 2018-10-20 DIAGNOSIS — M19071 Primary osteoarthritis, right ankle and foot: Secondary | ICD-10-CM | POA: Diagnosis not present

## 2018-10-20 DIAGNOSIS — M2022 Hallux rigidus, left foot: Secondary | ICD-10-CM | POA: Diagnosis not present

## 2018-10-20 DIAGNOSIS — I10 Essential (primary) hypertension: Secondary | ICD-10-CM | POA: Diagnosis not present

## 2018-10-20 DIAGNOSIS — Z9889 Other specified postprocedural states: Secondary | ICD-10-CM | POA: Diagnosis not present

## 2018-10-20 DIAGNOSIS — M2021 Hallux rigidus, right foot: Secondary | ICD-10-CM | POA: Diagnosis not present

## 2018-10-20 DIAGNOSIS — M19072 Primary osteoarthritis, left ankle and foot: Secondary | ICD-10-CM | POA: Diagnosis not present

## 2018-11-02 DIAGNOSIS — R319 Hematuria, unspecified: Secondary | ICD-10-CM | POA: Diagnosis not present

## 2018-11-02 DIAGNOSIS — R87612 Low grade squamous intraepithelial lesion on cytologic smear of cervix (LGSIL): Secondary | ICD-10-CM | POA: Diagnosis not present

## 2018-11-02 DIAGNOSIS — N390 Urinary tract infection, site not specified: Secondary | ICD-10-CM | POA: Diagnosis not present

## 2018-11-05 DIAGNOSIS — G43719 Chronic migraine without aura, intractable, without status migrainosus: Secondary | ICD-10-CM | POA: Diagnosis not present

## 2018-11-11 DIAGNOSIS — R300 Dysuria: Secondary | ICD-10-CM | POA: Diagnosis not present

## 2018-11-11 DIAGNOSIS — N3001 Acute cystitis with hematuria: Secondary | ICD-10-CM | POA: Diagnosis not present

## 2018-11-11 DIAGNOSIS — N898 Other specified noninflammatory disorders of vagina: Secondary | ICD-10-CM | POA: Diagnosis not present

## 2018-11-17 DIAGNOSIS — M542 Cervicalgia: Secondary | ICD-10-CM | POA: Diagnosis not present

## 2018-11-17 DIAGNOSIS — G518 Other disorders of facial nerve: Secondary | ICD-10-CM | POA: Diagnosis not present

## 2018-11-17 DIAGNOSIS — M791 Myalgia, unspecified site: Secondary | ICD-10-CM | POA: Diagnosis not present

## 2018-11-17 DIAGNOSIS — G43719 Chronic migraine without aura, intractable, without status migrainosus: Secondary | ICD-10-CM | POA: Diagnosis not present

## 2018-11-22 DIAGNOSIS — M25675 Stiffness of left foot, not elsewhere classified: Secondary | ICD-10-CM | POA: Diagnosis not present

## 2018-11-22 DIAGNOSIS — M6281 Muscle weakness (generalized): Secondary | ICD-10-CM | POA: Diagnosis not present

## 2018-11-22 DIAGNOSIS — R2689 Other abnormalities of gait and mobility: Secondary | ICD-10-CM | POA: Diagnosis not present

## 2018-11-22 DIAGNOSIS — M25572 Pain in left ankle and joints of left foot: Secondary | ICD-10-CM | POA: Diagnosis not present

## 2018-11-23 DIAGNOSIS — F902 Attention-deficit hyperactivity disorder, combined type: Secondary | ICD-10-CM | POA: Diagnosis not present

## 2018-11-23 DIAGNOSIS — Z79899 Other long term (current) drug therapy: Secondary | ICD-10-CM | POA: Diagnosis not present

## 2018-11-26 DIAGNOSIS — M25572 Pain in left ankle and joints of left foot: Secondary | ICD-10-CM | POA: Diagnosis not present

## 2018-11-26 DIAGNOSIS — M25675 Stiffness of left foot, not elsewhere classified: Secondary | ICD-10-CM | POA: Diagnosis not present

## 2018-11-26 DIAGNOSIS — R2689 Other abnormalities of gait and mobility: Secondary | ICD-10-CM | POA: Diagnosis not present

## 2018-11-26 DIAGNOSIS — M6281 Muscle weakness (generalized): Secondary | ICD-10-CM | POA: Diagnosis not present

## 2018-12-06 DIAGNOSIS — R2689 Other abnormalities of gait and mobility: Secondary | ICD-10-CM | POA: Diagnosis not present

## 2018-12-06 DIAGNOSIS — M25675 Stiffness of left foot, not elsewhere classified: Secondary | ICD-10-CM | POA: Diagnosis not present

## 2018-12-06 DIAGNOSIS — M6281 Muscle weakness (generalized): Secondary | ICD-10-CM | POA: Diagnosis not present

## 2018-12-06 DIAGNOSIS — M25572 Pain in left ankle and joints of left foot: Secondary | ICD-10-CM | POA: Diagnosis not present

## 2018-12-09 DIAGNOSIS — M25675 Stiffness of left foot, not elsewhere classified: Secondary | ICD-10-CM | POA: Diagnosis not present

## 2018-12-09 DIAGNOSIS — R2689 Other abnormalities of gait and mobility: Secondary | ICD-10-CM | POA: Diagnosis not present

## 2018-12-09 DIAGNOSIS — M25572 Pain in left ankle and joints of left foot: Secondary | ICD-10-CM | POA: Diagnosis not present

## 2018-12-09 DIAGNOSIS — M6281 Muscle weakness (generalized): Secondary | ICD-10-CM | POA: Diagnosis not present

## 2018-12-13 DIAGNOSIS — M25675 Stiffness of left foot, not elsewhere classified: Secondary | ICD-10-CM | POA: Diagnosis not present

## 2018-12-13 DIAGNOSIS — R2689 Other abnormalities of gait and mobility: Secondary | ICD-10-CM | POA: Diagnosis not present

## 2018-12-13 DIAGNOSIS — M6281 Muscle weakness (generalized): Secondary | ICD-10-CM | POA: Diagnosis not present

## 2018-12-13 DIAGNOSIS — M25572 Pain in left ankle and joints of left foot: Secondary | ICD-10-CM | POA: Diagnosis not present

## 2019-12-28 DIAGNOSIS — M5441 Lumbago with sciatica, right side: Secondary | ICD-10-CM | POA: Diagnosis not present

## 2019-12-28 DIAGNOSIS — M5135 Other intervertebral disc degeneration, thoracolumbar region: Secondary | ICD-10-CM | POA: Diagnosis not present

## 2020-01-30 DIAGNOSIS — M62838 Other muscle spasm: Secondary | ICD-10-CM | POA: Diagnosis not present

## 2020-01-30 DIAGNOSIS — M5135 Other intervertebral disc degeneration, thoracolumbar region: Secondary | ICD-10-CM | POA: Diagnosis not present

## 2020-01-30 DIAGNOSIS — S39012D Strain of muscle, fascia and tendon of lower back, subsequent encounter: Secondary | ICD-10-CM | POA: Diagnosis not present

## 2020-02-01 DIAGNOSIS — M5135 Other intervertebral disc degeneration, thoracolumbar region: Secondary | ICD-10-CM | POA: Diagnosis not present

## 2020-02-01 DIAGNOSIS — M62838 Other muscle spasm: Secondary | ICD-10-CM | POA: Diagnosis not present

## 2020-02-01 DIAGNOSIS — M5441 Lumbago with sciatica, right side: Secondary | ICD-10-CM | POA: Diagnosis not present

## 2020-02-08 DIAGNOSIS — F988 Other specified behavioral and emotional disorders with onset usually occurring in childhood and adolescence: Secondary | ICD-10-CM | POA: Diagnosis not present

## 2020-02-08 DIAGNOSIS — F339 Major depressive disorder, recurrent, unspecified: Secondary | ICD-10-CM | POA: Diagnosis not present

## 2020-02-08 DIAGNOSIS — F419 Anxiety disorder, unspecified: Secondary | ICD-10-CM | POA: Diagnosis not present

## 2020-02-10 DIAGNOSIS — S2341XD Sprain of ribs, subsequent encounter: Secondary | ICD-10-CM | POA: Diagnosis not present

## 2020-02-10 DIAGNOSIS — M62838 Other muscle spasm: Secondary | ICD-10-CM | POA: Diagnosis not present

## 2020-02-10 DIAGNOSIS — S39012D Strain of muscle, fascia and tendon of lower back, subsequent encounter: Secondary | ICD-10-CM | POA: Diagnosis not present

## 2020-03-07 DIAGNOSIS — F988 Other specified behavioral and emotional disorders with onset usually occurring in childhood and adolescence: Secondary | ICD-10-CM | POA: Diagnosis not present

## 2020-03-07 DIAGNOSIS — F419 Anxiety disorder, unspecified: Secondary | ICD-10-CM | POA: Diagnosis not present

## 2020-03-07 DIAGNOSIS — F339 Major depressive disorder, recurrent, unspecified: Secondary | ICD-10-CM | POA: Diagnosis not present

## 2020-04-06 DIAGNOSIS — N644 Mastodynia: Secondary | ICD-10-CM | POA: Diagnosis not present

## 2020-04-06 DIAGNOSIS — N393 Stress incontinence (female) (male): Secondary | ICD-10-CM | POA: Diagnosis not present

## 2020-04-06 DIAGNOSIS — N6320 Unspecified lump in the left breast, unspecified quadrant: Secondary | ICD-10-CM | POA: Diagnosis not present

## 2020-04-16 DIAGNOSIS — F988 Other specified behavioral and emotional disorders with onset usually occurring in childhood and adolescence: Secondary | ICD-10-CM | POA: Diagnosis not present

## 2020-04-16 DIAGNOSIS — F339 Major depressive disorder, recurrent, unspecified: Secondary | ICD-10-CM | POA: Diagnosis not present

## 2020-04-16 DIAGNOSIS — F419 Anxiety disorder, unspecified: Secondary | ICD-10-CM | POA: Diagnosis not present

## 2020-04-23 DIAGNOSIS — N6310 Unspecified lump in the right breast, unspecified quadrant: Secondary | ICD-10-CM | POA: Diagnosis not present

## 2020-04-23 DIAGNOSIS — N6320 Unspecified lump in the left breast, unspecified quadrant: Secondary | ICD-10-CM | POA: Diagnosis not present

## 2020-04-23 DIAGNOSIS — N644 Mastodynia: Secondary | ICD-10-CM | POA: Diagnosis not present

## 2020-05-01 DIAGNOSIS — Z961 Presence of intraocular lens: Secondary | ICD-10-CM | POA: Diagnosis not present

## 2020-05-01 DIAGNOSIS — H04129 Dry eye syndrome of unspecified lacrimal gland: Secondary | ICD-10-CM | POA: Diagnosis not present

## 2020-05-01 DIAGNOSIS — H25813 Combined forms of age-related cataract, bilateral: Secondary | ICD-10-CM | POA: Diagnosis not present

## 2020-05-17 DIAGNOSIS — N3941 Urge incontinence: Secondary | ICD-10-CM | POA: Diagnosis not present

## 2020-05-17 DIAGNOSIS — N3281 Overactive bladder: Secondary | ICD-10-CM | POA: Diagnosis not present

## 2020-05-22 DIAGNOSIS — F339 Major depressive disorder, recurrent, unspecified: Secondary | ICD-10-CM | POA: Diagnosis not present

## 2020-05-22 DIAGNOSIS — F419 Anxiety disorder, unspecified: Secondary | ICD-10-CM | POA: Diagnosis not present

## 2020-05-22 DIAGNOSIS — F988 Other specified behavioral and emotional disorders with onset usually occurring in childhood and adolescence: Secondary | ICD-10-CM | POA: Diagnosis not present

## 2020-05-30 DIAGNOSIS — M62838 Other muscle spasm: Secondary | ICD-10-CM | POA: Diagnosis not present

## 2020-05-30 DIAGNOSIS — M5135 Other intervertebral disc degeneration, thoracolumbar region: Secondary | ICD-10-CM | POA: Diagnosis not present

## 2020-05-30 DIAGNOSIS — S2341XD Sprain of ribs, subsequent encounter: Secondary | ICD-10-CM | POA: Diagnosis not present

## 2020-06-04 DIAGNOSIS — H25811 Combined forms of age-related cataract, right eye: Secondary | ICD-10-CM | POA: Diagnosis not present

## 2020-06-04 DIAGNOSIS — Z79899 Other long term (current) drug therapy: Secondary | ICD-10-CM | POA: Diagnosis not present

## 2020-06-04 DIAGNOSIS — I10 Essential (primary) hypertension: Secondary | ICD-10-CM | POA: Diagnosis not present

## 2020-06-04 DIAGNOSIS — Z793 Long term (current) use of hormonal contraceptives: Secondary | ICD-10-CM | POA: Diagnosis not present

## 2020-06-04 DIAGNOSIS — H25019 Cortical age-related cataract, unspecified eye: Secondary | ICD-10-CM | POA: Diagnosis not present

## 2020-06-04 DIAGNOSIS — G43909 Migraine, unspecified, not intractable, without status migrainosus: Secondary | ICD-10-CM | POA: Diagnosis not present

## 2020-06-18 DIAGNOSIS — Z793 Long term (current) use of hormonal contraceptives: Secondary | ICD-10-CM | POA: Diagnosis not present

## 2020-06-18 DIAGNOSIS — Z882 Allergy status to sulfonamides status: Secondary | ICD-10-CM | POA: Diagnosis not present

## 2020-06-18 DIAGNOSIS — G43909 Migraine, unspecified, not intractable, without status migrainosus: Secondary | ICD-10-CM | POA: Diagnosis not present

## 2020-06-18 DIAGNOSIS — Z85828 Personal history of other malignant neoplasm of skin: Secondary | ICD-10-CM | POA: Diagnosis not present

## 2020-06-18 DIAGNOSIS — Z79899 Other long term (current) drug therapy: Secondary | ICD-10-CM | POA: Diagnosis not present

## 2020-06-18 DIAGNOSIS — I10 Essential (primary) hypertension: Secondary | ICD-10-CM | POA: Diagnosis not present

## 2020-06-18 DIAGNOSIS — H25812 Combined forms of age-related cataract, left eye: Secondary | ICD-10-CM | POA: Diagnosis not present

## 2020-07-17 DIAGNOSIS — Z961 Presence of intraocular lens: Secondary | ICD-10-CM | POA: Diagnosis not present

## 2020-07-17 DIAGNOSIS — Z9842 Cataract extraction status, left eye: Secondary | ICD-10-CM | POA: Diagnosis not present

## 2020-08-18 DIAGNOSIS — J019 Acute sinusitis, unspecified: Secondary | ICD-10-CM | POA: Diagnosis not present

## 2020-08-18 DIAGNOSIS — B9689 Other specified bacterial agents as the cause of diseases classified elsewhere: Secondary | ICD-10-CM | POA: Diagnosis not present

## 2020-09-10 DIAGNOSIS — R300 Dysuria: Secondary | ICD-10-CM | POA: Diagnosis not present

## 2020-09-10 DIAGNOSIS — R05 Cough: Secondary | ICD-10-CM | POA: Diagnosis not present

## 2020-09-10 DIAGNOSIS — B948 Sequelae of other specified infectious and parasitic diseases: Secondary | ICD-10-CM | POA: Diagnosis not present

## 2020-09-10 DIAGNOSIS — R0989 Other specified symptoms and signs involving the circulatory and respiratory systems: Secondary | ICD-10-CM | POA: Diagnosis not present

## 2020-09-10 DIAGNOSIS — Z8616 Personal history of COVID-19: Secondary | ICD-10-CM | POA: Diagnosis not present

## 2020-09-10 DIAGNOSIS — Z23 Encounter for immunization: Secondary | ICD-10-CM | POA: Diagnosis not present

## 2020-09-26 DIAGNOSIS — M791 Myalgia, unspecified site: Secondary | ICD-10-CM | POA: Diagnosis not present

## 2020-09-26 DIAGNOSIS — Z13220 Encounter for screening for lipoid disorders: Secondary | ICD-10-CM | POA: Diagnosis not present

## 2020-09-26 DIAGNOSIS — M2550 Pain in unspecified joint: Secondary | ICD-10-CM | POA: Diagnosis not present

## 2020-09-26 DIAGNOSIS — Z0000 Encounter for general adult medical examination without abnormal findings: Secondary | ICD-10-CM | POA: Diagnosis not present

## 2020-09-26 DIAGNOSIS — R5382 Chronic fatigue, unspecified: Secondary | ICD-10-CM | POA: Diagnosis not present

## 2020-09-26 DIAGNOSIS — Z1159 Encounter for screening for other viral diseases: Secondary | ICD-10-CM | POA: Diagnosis not present

## 2020-09-26 DIAGNOSIS — Z114 Encounter for screening for human immunodeficiency virus [HIV]: Secondary | ICD-10-CM | POA: Diagnosis not present

## 2020-09-26 DIAGNOSIS — R1031 Right lower quadrant pain: Secondary | ICD-10-CM | POA: Diagnosis not present

## 2020-10-16 DIAGNOSIS — F419 Anxiety disorder, unspecified: Secondary | ICD-10-CM | POA: Diagnosis not present

## 2020-10-16 DIAGNOSIS — F988 Other specified behavioral and emotional disorders with onset usually occurring in childhood and adolescence: Secondary | ICD-10-CM | POA: Diagnosis not present

## 2020-10-16 DIAGNOSIS — F339 Major depressive disorder, recurrent, unspecified: Secondary | ICD-10-CM | POA: Diagnosis not present

## 2020-11-07 DIAGNOSIS — F411 Generalized anxiety disorder: Secondary | ICD-10-CM | POA: Diagnosis not present

## 2020-11-14 DIAGNOSIS — F411 Generalized anxiety disorder: Secondary | ICD-10-CM | POA: Diagnosis not present

## 2020-11-20 DIAGNOSIS — F419 Anxiety disorder, unspecified: Secondary | ICD-10-CM | POA: Diagnosis not present

## 2020-11-20 DIAGNOSIS — F988 Other specified behavioral and emotional disorders with onset usually occurring in childhood and adolescence: Secondary | ICD-10-CM | POA: Diagnosis not present

## 2020-11-20 DIAGNOSIS — F339 Major depressive disorder, recurrent, unspecified: Secondary | ICD-10-CM | POA: Diagnosis not present

## 2020-11-20 DIAGNOSIS — N950 Postmenopausal bleeding: Secondary | ICD-10-CM | POA: Diagnosis not present

## 2020-11-20 DIAGNOSIS — Z01419 Encounter for gynecological examination (general) (routine) without abnormal findings: Secondary | ICD-10-CM | POA: Diagnosis not present

## 2020-11-20 DIAGNOSIS — Z79890 Hormone replacement therapy (postmenopausal): Secondary | ICD-10-CM | POA: Diagnosis not present

## 2020-11-20 DIAGNOSIS — Z1151 Encounter for screening for human papillomavirus (HPV): Secondary | ICD-10-CM | POA: Diagnosis not present

## 2020-11-20 DIAGNOSIS — K648 Other hemorrhoids: Secondary | ICD-10-CM | POA: Diagnosis not present

## 2020-11-21 DIAGNOSIS — F411 Generalized anxiety disorder: Secondary | ICD-10-CM | POA: Diagnosis not present

## 2020-11-23 DIAGNOSIS — M25511 Pain in right shoulder: Secondary | ICD-10-CM | POA: Diagnosis not present

## 2020-11-23 DIAGNOSIS — S76012A Strain of muscle, fascia and tendon of left hip, initial encounter: Secondary | ICD-10-CM | POA: Diagnosis not present

## 2020-12-17 DIAGNOSIS — Z20822 Contact with and (suspected) exposure to covid-19: Secondary | ICD-10-CM | POA: Diagnosis not present

## 2020-12-26 DIAGNOSIS — F411 Generalized anxiety disorder: Secondary | ICD-10-CM | POA: Diagnosis not present

## 2021-01-09 DIAGNOSIS — F411 Generalized anxiety disorder: Secondary | ICD-10-CM | POA: Diagnosis not present

## 2021-01-15 DIAGNOSIS — E559 Vitamin D deficiency, unspecified: Secondary | ICD-10-CM | POA: Diagnosis not present

## 2021-01-15 DIAGNOSIS — M25511 Pain in right shoulder: Secondary | ICD-10-CM | POA: Diagnosis not present

## 2021-01-15 DIAGNOSIS — E538 Deficiency of other specified B group vitamins: Secondary | ICD-10-CM | POA: Diagnosis not present

## 2021-01-15 DIAGNOSIS — D513 Other dietary vitamin B12 deficiency anemia: Secondary | ICD-10-CM | POA: Diagnosis not present

## 2021-01-15 DIAGNOSIS — M25552 Pain in left hip: Secondary | ICD-10-CM | POA: Diagnosis not present

## 2021-01-15 DIAGNOSIS — E278 Other specified disorders of adrenal gland: Secondary | ICD-10-CM | POA: Diagnosis not present

## 2021-01-15 DIAGNOSIS — M542 Cervicalgia: Secondary | ICD-10-CM | POA: Diagnosis not present

## 2021-01-15 DIAGNOSIS — M2550 Pain in unspecified joint: Secondary | ICD-10-CM | POA: Diagnosis not present

## 2021-01-22 DIAGNOSIS — F988 Other specified behavioral and emotional disorders with onset usually occurring in childhood and adolescence: Secondary | ICD-10-CM | POA: Diagnosis not present

## 2021-01-22 DIAGNOSIS — F339 Major depressive disorder, recurrent, unspecified: Secondary | ICD-10-CM | POA: Diagnosis not present

## 2021-01-22 DIAGNOSIS — F419 Anxiety disorder, unspecified: Secondary | ICD-10-CM | POA: Diagnosis not present

## 2021-01-23 DIAGNOSIS — F411 Generalized anxiety disorder: Secondary | ICD-10-CM | POA: Diagnosis not present

## 2021-01-30 DIAGNOSIS — F339 Major depressive disorder, recurrent, unspecified: Secondary | ICD-10-CM | POA: Diagnosis not present

## 2021-01-30 DIAGNOSIS — F988 Other specified behavioral and emotional disorders with onset usually occurring in childhood and adolescence: Secondary | ICD-10-CM | POA: Diagnosis not present

## 2021-01-30 DIAGNOSIS — F419 Anxiety disorder, unspecified: Secondary | ICD-10-CM | POA: Diagnosis not present

## 2021-02-13 DIAGNOSIS — F339 Major depressive disorder, recurrent, unspecified: Secondary | ICD-10-CM | POA: Diagnosis not present

## 2021-02-13 DIAGNOSIS — M25511 Pain in right shoulder: Secondary | ICD-10-CM | POA: Diagnosis not present

## 2021-02-13 DIAGNOSIS — F988 Other specified behavioral and emotional disorders with onset usually occurring in childhood and adolescence: Secondary | ICD-10-CM | POA: Diagnosis not present

## 2021-02-13 DIAGNOSIS — M25552 Pain in left hip: Secondary | ICD-10-CM | POA: Diagnosis not present

## 2021-02-13 DIAGNOSIS — F419 Anxiety disorder, unspecified: Secondary | ICD-10-CM | POA: Diagnosis not present

## 2021-02-13 DIAGNOSIS — M5459 Other low back pain: Secondary | ICD-10-CM | POA: Diagnosis not present

## 2021-02-13 DIAGNOSIS — Z712 Person consulting for explanation of examination or test findings: Secondary | ICD-10-CM | POA: Diagnosis not present

## 2021-02-28 DIAGNOSIS — M25552 Pain in left hip: Secondary | ICD-10-CM | POA: Diagnosis not present

## 2021-02-28 DIAGNOSIS — M5459 Other low back pain: Secondary | ICD-10-CM | POA: Diagnosis not present

## 2021-03-05 DIAGNOSIS — M5459 Other low back pain: Secondary | ICD-10-CM | POA: Diagnosis not present

## 2021-03-05 DIAGNOSIS — M25552 Pain in left hip: Secondary | ICD-10-CM | POA: Diagnosis not present

## 2021-03-05 DIAGNOSIS — M1612 Unilateral primary osteoarthritis, left hip: Secondary | ICD-10-CM | POA: Diagnosis not present

## 2021-03-12 DIAGNOSIS — J0100 Acute maxillary sinusitis, unspecified: Secondary | ICD-10-CM | POA: Diagnosis not present

## 2021-03-13 DIAGNOSIS — F419 Anxiety disorder, unspecified: Secondary | ICD-10-CM | POA: Diagnosis not present

## 2021-03-13 DIAGNOSIS — F988 Other specified behavioral and emotional disorders with onset usually occurring in childhood and adolescence: Secondary | ICD-10-CM | POA: Diagnosis not present

## 2021-03-13 DIAGNOSIS — F339 Major depressive disorder, recurrent, unspecified: Secondary | ICD-10-CM | POA: Diagnosis not present

## 2021-03-27 DIAGNOSIS — F411 Generalized anxiety disorder: Secondary | ICD-10-CM | POA: Diagnosis not present

## 2021-04-10 DIAGNOSIS — F339 Major depressive disorder, recurrent, unspecified: Secondary | ICD-10-CM | POA: Diagnosis not present

## 2021-04-10 DIAGNOSIS — F419 Anxiety disorder, unspecified: Secondary | ICD-10-CM | POA: Diagnosis not present

## 2021-04-10 DIAGNOSIS — F988 Other specified behavioral and emotional disorders with onset usually occurring in childhood and adolescence: Secondary | ICD-10-CM | POA: Diagnosis not present

## 2021-04-23 DIAGNOSIS — M79605 Pain in left leg: Secondary | ICD-10-CM | POA: Diagnosis not present

## 2021-04-23 DIAGNOSIS — M79604 Pain in right leg: Secondary | ICD-10-CM | POA: Diagnosis not present

## 2021-04-23 DIAGNOSIS — I83811 Varicose veins of right lower extremities with pain: Secondary | ICD-10-CM | POA: Diagnosis not present

## 2021-04-25 DIAGNOSIS — G43719 Chronic migraine without aura, intractable, without status migrainosus: Secondary | ICD-10-CM | POA: Diagnosis not present

## 2021-06-19 DIAGNOSIS — M25552 Pain in left hip: Secondary | ICD-10-CM | POA: Diagnosis not present

## 2021-06-19 DIAGNOSIS — M5136 Other intervertebral disc degeneration, lumbar region: Secondary | ICD-10-CM | POA: Diagnosis not present

## 2021-06-27 ENCOUNTER — Telehealth: Payer: Self-pay

## 2021-06-27 NOTE — Telephone Encounter (Signed)
Per Dr. Roda Shutters make patient an appt with him next available.   Called patient no answer LMOM. To return our call.

## 2021-07-05 ENCOUNTER — Ambulatory Visit: Payer: Self-pay

## 2021-07-05 ENCOUNTER — Other Ambulatory Visit: Payer: Self-pay

## 2021-07-05 ENCOUNTER — Encounter: Payer: Self-pay | Admitting: Orthopaedic Surgery

## 2021-07-05 ENCOUNTER — Ambulatory Visit (INDEPENDENT_AMBULATORY_CARE_PROVIDER_SITE_OTHER): Payer: BC Managed Care – PPO | Admitting: Orthopaedic Surgery

## 2021-07-05 VITALS — Ht 60.0 in | Wt 125.0 lb

## 2021-07-05 DIAGNOSIS — M1612 Unilateral primary osteoarthritis, left hip: Secondary | ICD-10-CM | POA: Diagnosis not present

## 2021-07-05 MED ORDER — TRAMADOL HCL 50 MG PO TABS: 50.0000 mg | ORAL_TABLET | Freq: Three times a day (TID) | ORAL | 2 refills | Status: DC | PRN

## 2021-07-05 NOTE — Progress Notes (Signed)
Subjective: Patient is here for ultrasound-guided intra-articular left hip injection.   Pain from DJD.  Objective:  Pain with IR.  Procedure: Ultrasound guided injection is preferred based studies that show increased duration, increased effect, greater accuracy, decreased procedural pain, increased response rate, and decreased cost with ultrasound guided versus blind injection.   Verbal informed consent obtained.  Time-out conducted.  Noted no overlying erythema, induration, or other signs of local infection. Ultrasound-guided left hip injection: After sterile prep with Betadine, injected 4 cc 0.25% bupivacaine without epinephrine and 6 mg betamethasone using a 22-gauge spinal needle, passing the needle through the iliofemoral ligament into the femoral head/neck junction.  Injectate seen filling the joint capsule.

## 2021-07-05 NOTE — Progress Notes (Signed)
Office Visit Note   Patient: Sheila Salazar           Date of Birth: [birth year]           MRN: [id number] Visit Date: 07/05/2021              Requested by: No referring provider defined for this encounter. PCP: Patient, No Pcp Per (Inactive)   Assessment & Plan: Visit Diagnoses:  1. Primary osteoarthritis of left hip     Plan: Impression is left hip osteoarthritis.  The patient has underlying chronic low back pain on the left, but I believe her symptoms are more consistent with left hip osteoarthritis and possibly a torn labrum.  We have discussed treatment options including prescription medications, intra-articular cortisone injection, PT, and total hip arthroplasty.  She would like to proceed with injection first.  We will refer her to Dr. Prince Rome for this.  Hip replacement handout provided.  Follow-up with [place] as needed.  Follow-Up Instructions: Return if symptoms worsen or fail to improve.   Orders:  Orders Placed This Encounter  Procedures   XR Lumbar Spine 2-3 Views   XR HIP UNILAT W OR W/O PELVIS 2-3 VIEWS LEFT   US Guided Needle Placement - No Linked Charges   Meds ordered this encounter  Medications   traMADol (ULTRAM) 50 MG tablet    Sig: Take 1 tablet (50 mg total) by mouth 3 (three) times daily as needed.    Dispense:  60 tablet    Refill:  2       Procedures: No procedures performed   Clinical Data: No additional findings.   Subjective: Chief Complaint  Patient presents with   Left Leg - Pain    HPI patient is a pleasant 55 year old female who comes in today with left hip pain.  The pain she has is to the groin and anterior thigh.  She has been dealing with this for the past 8 months.  She does note chronic left lower back pain but this is not new.  Pain is worse with hip flexion, external rotation as well as with coughing and sneezing.  She has been taking diclofenac without significant relief.  No paresthesias or weakness to either lower extremity.  She  has not previously undergone left hip injection.  Review of Systems as detailed in HPI.  All others reviewed and negative.   Objective: Vital Signs: Ht 5' (1.524 m)   Wt 125 lb (56.7 kg)   BMI 24.41 kg/m   Physical Exam well-developed and well-nourished female in no acute distress.  Alert and oriented x3.  Ortho Exam left hip exam shows a markedly positive logroll and FADIR.  Negative straight leg raise.  No focal weakness.  She is neurovascular intact distally.  Specialty Comments:  No specialty comments available.  Imaging: US Guided Needle Placement - No Linked Charges  Result Date: 07/05/2021 Ultrasound guided injection is preferred based studies that show increased duration, increased effect, greater accuracy, decreased procedural pain, increased response rate, and decreased cost with ultrasound guided versus blind injection.   Verbal informed consent obtained.  Time-out conducted.  Noted no overlying erythema, induration, or other signs of local infection. Ultrasound-guided left hip injection: After sterile prep with Betadine, injected 4 cc 0.25% bupivacaine without epinephrine and 6 mg betamethasone using a 22-gauge spinal needle, passing the needle through the iliofemoral ligament into the femoral head/neck junction.  Injectate seen filling the joint capsule.  XR HIP UNILAT W OR W/O PELVIS 2-3  VIEWS LEFT  Result Date: 07/05/2021 Moderate symmetric joint space narrowing of the left hip  XR Lumbar Spine 2-3 Views  Result Date: 07/05/2021 Degenerative changes noted L5-S1    PMFS History: Patient Active Problem List   Diagnosis Date Noted   Nonallopathic lesion of sacral region 07/27/2018   Nonallopathic lesion of rib cage 07/27/2018   Patellofemoral syndrome of both knees 02/10/2017   Right lumbar radiculopathy 03/06/2015   SI (sacroiliac) joint dysfunction 08/25/2014   Abdominal pain, chronic, right lower quadrant 04/04/2014   Hip flexor tightness 02/22/2014   Neck  muscle spasm 01/31/2014   Nonallopathic lesion of cervical region 01/31/2014   Nonallopathic lesion of thoracic region 01/31/2014   Nonallopathic lesion of lumbosacral region 01/31/2014   Past Medical History:  Diagnosis Date   Allergy     Family History  Problem Relation Age of Onset   Breast cancer Mother     History reviewed. No pertinent surgical history. Social History   Occupational History   Not on file  Tobacco Use   Smoking status: Never   Smokeless tobacco: Never  Substance and Sexual Activity   Alcohol use: Not on file   Drug use: Not on file   Sexual activity: Not on file

## 2021-07-10 DIAGNOSIS — F988 Other specified behavioral and emotional disorders with onset usually occurring in childhood and adolescence: Secondary | ICD-10-CM | POA: Diagnosis not present

## 2021-07-10 DIAGNOSIS — F419 Anxiety disorder, unspecified: Secondary | ICD-10-CM | POA: Diagnosis not present

## 2021-07-10 DIAGNOSIS — F339 Major depressive disorder, recurrent, unspecified: Secondary | ICD-10-CM | POA: Diagnosis not present

## 2021-07-31 ENCOUNTER — Other Ambulatory Visit: Payer: Self-pay

## 2021-07-31 ENCOUNTER — Encounter: Payer: Self-pay | Admitting: Orthopaedic Surgery

## 2021-07-31 ENCOUNTER — Ambulatory Visit: Payer: BC Managed Care – PPO | Admitting: Orthopaedic Surgery

## 2021-07-31 DIAGNOSIS — M1612 Unilateral primary osteoarthritis, left hip: Secondary | ICD-10-CM | POA: Diagnosis not present

## 2021-07-31 NOTE — Progress Notes (Signed)
   Office Visit Note   Patient: Sheila Salazar           Date of Birth: [birth year]           MRN: [id number] Visit Date: 07/31/2021              Requested by: No referring provider defined for this encounter. PCP: Patient, No Pcp Per (Inactive)   Assessment & Plan: Visit Diagnoses:  1. Primary osteoarthritis of left hip     Plan: Based on findings impression is left hip DJD with temporary relief from joint injection.  She has also failed conservative treatments such as home exercise program and over-the-counter medications.  She has chronic pain that affects her ability to perform daily activities and her job.  Based on her treatment options she has elected to proceed with a left total hip replacement soon as possible.  Risk benefits rehab recovery alternatives to surgery were discussed with the patient detail.  Questions encouraged and answered.  Follow-Up Instructions: Return for Postop.   Orders:  No orders of the defined types were placed in this encounter.  No orders of the defined types were placed in this encounter.     Procedures: No procedures performed   Clinical Data: No additional findings.   Subjective: Chief Complaint  Patient presents with   Left Hip - Pain    [name] returns today for follow-up of left hip DJD and pain.  She underwent a left hip joint injection on 7/15 with Dr. Prince Rome which worked extremely well but only for 2 weeks.  The pain has gradually returned.  Currently is 5-6 out of 10.  She has constant pain with all daily activities as well as nighttime pain.   Review of Systems   Objective: Vital Signs: There were no vitals taken for this visit.  Physical Exam  Ortho Exam Left hip shows positive FADIR and [place]inchfield.  No sciatic tension signs.  Lateral hip is nontender.  Significant limitation in range of motion of the hip. Specialty Comments:  No specialty comments available.  Imaging: No results found.   PMFS History: Patient  Active Problem List   Diagnosis Date Noted   Nonallopathic lesion of sacral region 07/27/2018   Nonallopathic lesion of rib cage 07/27/2018   Patellofemoral syndrome of both knees 02/10/2017   Right lumbar radiculopathy 03/06/2015   SI (sacroiliac) joint dysfunction 08/25/2014   Abdominal pain, chronic, right lower quadrant 04/04/2014   Hip flexor tightness 02/22/2014   Neck muscle spasm 01/31/2014   Nonallopathic lesion of cervical region 01/31/2014   Nonallopathic lesion of thoracic region 01/31/2014   Nonallopathic lesion of lumbosacral region 01/31/2014   Past Medical History:  Diagnosis Date   Allergy     Family History  Problem Relation Age of Onset   Breast cancer Mother     History reviewed. No pertinent surgical history. Social History   Occupational History   Not on file  Tobacco Use   Smoking status: Never   Smokeless tobacco: Never  Substance and Sexual Activity   Alcohol use: Not on file   Drug use: Not on file   Sexual activity: Not on file

## 2021-09-03 DIAGNOSIS — Z20822 Contact with and (suspected) exposure to covid-19: Secondary | ICD-10-CM | POA: Diagnosis not present

## 2021-09-03 DIAGNOSIS — J029 Acute pharyngitis, unspecified: Secondary | ICD-10-CM | POA: Diagnosis not present

## 2021-09-03 DIAGNOSIS — J069 Acute upper respiratory infection, unspecified: Secondary | ICD-10-CM | POA: Diagnosis not present

## 2021-09-03 DIAGNOSIS — R059 Cough, unspecified: Secondary | ICD-10-CM | POA: Diagnosis not present

## 2021-09-03 DIAGNOSIS — Z20828 Contact with and (suspected) exposure to other viral communicable diseases: Secondary | ICD-10-CM | POA: Diagnosis not present

## 2021-09-04 DIAGNOSIS — R0981 Nasal congestion: Secondary | ICD-10-CM | POA: Diagnosis not present

## 2021-09-04 DIAGNOSIS — R059 Cough, unspecified: Secondary | ICD-10-CM | POA: Diagnosis not present

## 2021-09-04 DIAGNOSIS — U071 COVID-19: Secondary | ICD-10-CM | POA: Diagnosis not present

## 2021-09-04 DIAGNOSIS — R519 Headache, unspecified: Secondary | ICD-10-CM | POA: Diagnosis not present

## 2021-09-06 ENCOUNTER — Telehealth: Payer: Self-pay | Admitting: Orthopaedic Surgery

## 2021-09-06 NOTE — Telephone Encounter (Signed)
Pt called to inform Dr. Roda Shutters staff she tested positive for covid. Informed Debbie ( surgery scheduler)

## 2021-09-09 NOTE — Telephone Encounter (Signed)
Ok, thanks.

## 2021-09-11 ENCOUNTER — Other Ambulatory Visit: Payer: Self-pay | Admitting: Physician Assistant

## 2021-09-11 MED ORDER — OXYCODONE-ACETAMINOPHEN 5-325 MG PO TABS: 1.0000 | ORAL_TABLET | Freq: Four times a day (QID) | ORAL | 0 refills | Status: DC | PRN

## 2021-09-11 MED ORDER — ASPIRIN EC 81 MG PO TBEC: 81.0000 mg | DELAYED_RELEASE_TABLET | Freq: Two times a day (BID) | ORAL | 0 refills | Status: DC

## 2021-09-11 MED ORDER — METHOCARBAMOL 500 MG PO TABS: 500.0000 mg | ORAL_TABLET | Freq: Two times a day (BID) | ORAL | 0 refills | Status: DC | PRN

## 2021-09-11 MED ORDER — DOCUSATE SODIUM 100 MG PO CAPS: 100.0000 mg | ORAL_CAPSULE | Freq: Every day | ORAL | 2 refills | Status: DC | PRN

## 2021-09-11 MED ORDER — ONDANSETRON HCL 4 MG PO TABS: 4.0000 mg | ORAL_TABLET | Freq: Three times a day (TID) | ORAL | 0 refills | Status: DC | PRN

## 2021-09-11 NOTE — Telephone Encounter (Signed)
Patient left a message with our front office staff on September 16th.  She had been trying to reach me directly because she had tested positive for Covid earlier in the week.  Patient stated she had taken a home test on Monday September 12th and the results were positive. On September 13th, patient notified her employer (providing employer with picture of a positive home test)  Patient also went to a Covid clinic  [facility]) on 13th of September and was provided with an order number (patient has emails for supporting documentation of date and time). September 14th the patient received the results from the [facility] confirming positive.  I spoke with patient on 16th in reference to the time frame of testing positive and her upcoming surgery date. I told her I thought she would be fine, but I would reach out to [facility] in pre-admissions.  Dondra Spry did say the patient would need some type of documentation at her PAT visit so she would not have to take the test again.  I relayed this information to the patient. Patient stated she understood.

## 2021-09-11 NOTE — Telephone Encounter (Signed)
Ok, thanks.

## 2021-09-11 NOTE — Progress Notes (Signed)
Surgical Instructions    Your procedure is scheduled on September 16, 2021.  Report to [facility] Main Entrance "A" at 05:30 A.M., then check in with the Admitting office.  Call this number if you have problems the morning of surgery:  [phone]   If you have any questions prior to your surgery date call [phone]: Open Monday-Friday 8am-4pm    Remember:  Do not eat after midnight the night before your surgery  You may drink clear liquids until 04:30 the morning of your surgery.   Clear liquids allowed are: Water, Non-Citrus Juices (without pulp), Carbonated Beverages, Clear Tea, Black Coffee ONLY (NO MILK, CREAM OR POWDERED CREAMER of any kind), and Gatorade   Enhanced Recovery after Surgery for Orthopedics Enhanced Recovery after Surgery is a protocol used to improve the stress on your body and your recovery after surgery.  Patient Instructions  The day of surgery (if you do NOT have diabetes):  Drink ONE (1) Pre-Surgery Clear Ensure by 4:30am the morning of surgery   This drink was given to you during your hospital  pre-op appointment visit. Nothing else to drink after completing the  Pre-Surgery Clear Ensure.     Take these medicines the morning of surgery with A SIP OF WATER  AmLODipine (NORVASC) BuPROPion (WELLBUTRIN XL)  Estradiol (ESTRACE) Venlafaxine XR (EFFEXOR-XR  If needed: Ondansetron (ZOFRAN) Acetaminophen (TYLENOL)   As of today, STOP taking any Aspirin (unless otherwise instructed by your surgeon) Aleve, Naproxen, Ibuprofen, Motrin, Advil, Goody's, BC's, all herbal medications, fish oil, and all vitamins.          Do not wear jewelry or makeup Do not wear lotions, powders, perfumes, or deodorant. Do not shave 48 hours prior to surgery.  Do not bring valuables to the hospital. DO Not wear nail polish, gel polish, artificial nails, or any other type of covering on natural nails including finger and toenails. If patients have artificial nails, gel  coating, etc. that need to be removed by a nail salon please have this removed prior to surgery. Surgery may need to be canceled/delayed if the surgeon/ anesthesia feels like the patient is unable to be adequately monitored.             [facility] is not responsible for any belongings or valuables.  Do NOT Smoke (Tobacco/Vaping)  24 hours prior to your procedure If you use a CPAP at night, you may bring your mask for your overnight stay.   Contacts, glasses, dentures or bridgework may not be worn into surgery, please bring cases for these belongings   For patients admitted to the hospital, discharge time will be determined by your treatment team.   Patients discharged the day of surgery will not be allowed to drive home, and someone needs to stay with them for 24 hours.  NO VISITORS WILL BE ALLOWED IN PRE-OP WHERE PATIENTS GET READY FOR SURGERY.  ONLY 1 SUPPORT PERSON MAY BE PRESENT WHILE YOU ARE IN SURGERY.  IF YOU ARE TO BE ADMITTED, ONCE YOU ARE IN YOUR ROOM YOU WILL BE ALLOWED TWO (2) VISITORS.  Minor children may have two parents present. Special consideration for safety and communication needs will be reviewed on a case by case basis.  Special instructions:    Oral Hygiene is also important to reduce your risk of infection.  Remember - BRUSH YOUR TEETH THE MORNING OF SURGERY WITH YOUR REGULAR TOOTHPASTE   [facility]- Preparing For Surgery  Before surgery, you can play an important role.  Because skin is not sterile, your skin needs to be as free of germs as possible. You can reduce the number of germs on your skin by washing with CHG (chlorahexidine gluconate) Soap before surgery.  CHG is an antiseptic cleaner which kills germs and bonds with the skin to continue killing germs even after washing.     Please do not use if you have an allergy to CHG or antibacterial soaps. If your skin becomes reddened/irritated stop using the CHG.  Do not shave (including legs and underarms) for at  least 48 hours prior to first CHG shower. It is OK to shave your face.  Please follow these instructions carefully.     Shower the NIGHT BEFORE SURGERY and the MORNING OF SURGERY with CHG Soap.   If you chose to wash your hair, wash your hair first as usual with your normal shampoo. After you shampoo, rinse your hair and body thoroughly to remove the shampoo.  Then [employer] and genitals (private parts) with your normal soap and rinse thoroughly to remove soap.  After that Use CHG Soap as you would any other liquid soap. You can apply CHG directly to the skin and wash gently with a scrungie or a clean washcloth.   Apply the CHG Soap to your body ONLY FROM THE NECK DOWN.  Do not use on open wounds or open sores. Avoid contact with your eyes, ears, mouth and genitals (private parts). Wash Face and genitals (private parts)  with your normal soap.   Wash thoroughly, paying special attention to the area where your surgery will be performed.  Thoroughly rinse your body with warm water from the neck down.  DO NOT shower/wash with your normal soap after using and rinsing off the CHG Soap.  Pat yourself dry with a CLEAN TOWEL.  Wear CLEAN PAJAMAS to bed the night before surgery  Place CLEAN SHEETS on your bed the night before your surgery  DO NOT SLEEP WITH PETS.   Day of Surgery:  Take a shower with CHG soap. Wear Clean/Comfortable clothing the morning of surgery Do not apply any deodorants/lotions.   Remember to brush your teeth WITH YOUR REGULAR TOOTHPASTE.   Please read over the following fact sheets that you were given.

## 2021-09-12 ENCOUNTER — Other Ambulatory Visit: Payer: Self-pay

## 2021-09-12 ENCOUNTER — Encounter (HOSPITAL_COMMUNITY): Payer: Self-pay

## 2021-09-12 ENCOUNTER — Encounter (HOSPITAL_COMMUNITY)
Admission: RE | Admit: 2021-09-12 | Discharge: 2021-09-12 | Disposition: A | Discharge status: Home / Self Care | Payer: BC Managed Care – PPO | Source: Ambulatory Visit | Attending: Orthopaedic Surgery | Admitting: Orthopaedic Surgery

## 2021-09-12 DIAGNOSIS — Z01812 Encounter for preprocedural laboratory examination: Secondary | ICD-10-CM | POA: Diagnosis not present

## 2021-09-12 HISTORY — DX: Anxiety disorder, unspecified: F41.9

## 2021-09-12 HISTORY — DX: Attention-deficit hyperactivity disorder, unspecified type: F90.9

## 2021-09-12 HISTORY — DX: Essential (primary) hypertension: I10

## 2021-09-12 HISTORY — DX: Headache, unspecified: R51.9

## 2021-09-12 HISTORY — DX: Depression, unspecified: F32.A

## 2021-09-12 LAB — CBC WITH DIFFERENTIAL/PLATELET
Abs Immature Granulocytes: 0.04 10*3/uL (ref 0.00–0.07)
Basophils Absolute: 0 10*3/uL (ref 0.0–0.1)
Basophils Relative: 1 %
Eosinophils Absolute: 0.1 10*3/uL (ref 0.0–0.5)
Eosinophils Relative: 3 %
HCT: 44.3 % (ref 36.0–46.0)
Hemoglobin: 14.5 g/dL (ref 12.0–15.0)
Immature Granulocytes: 1 %
Lymphocytes Relative: 18 %
Lymphs Abs: 1 10*3/uL (ref 0.7–4.0)
MCH: 29.2 pg (ref 26.0–34.0)
MCHC: 32.7 g/dL (ref 30.0–36.0)
MCV: 89.1 fL (ref 80.0–100.0)
Monocytes Absolute: 0.5 10*3/uL (ref 0.1–1.0)
Monocytes Relative: 9 %
Neutro Abs: 3.9 10*3/uL (ref 1.7–7.7)
Neutrophils Relative %: 68 %
Platelets: 465 10*3/uL — ABNORMAL HIGH (ref 150–400)
RBC: 4.97 MIL/uL (ref 3.87–5.11)
RDW: 13.1 % (ref 11.5–15.5)
WBC: 5.7 10*3/uL (ref 4.0–10.5)
nRBC: 0 % (ref 0.0–0.2)

## 2021-09-12 LAB — URINALYSIS, ROUTINE W REFLEX MICROSCOPIC
Bilirubin Urine: NEGATIVE
Glucose, UA: NEGATIVE mg/dL
Ketones, ur: NEGATIVE mg/dL
Nitrite: NEGATIVE
Protein, ur: NEGATIVE mg/dL
Specific Gravity, Urine: 1.015 (ref 1.005–1.030)
pH: 5.5 (ref 5.0–8.0)

## 2021-09-12 LAB — PROTIME-INR
INR: 0.9 (ref 0.8–1.2)
Prothrombin Time: 12.5 seconds (ref 11.4–15.2)

## 2021-09-12 LAB — COMPREHENSIVE METABOLIC PANEL
ALT: 34 U/L (ref 0–44)
AST: 23 U/L (ref 15–41)
Albumin: 3.9 g/dL (ref 3.5–5.0)
Alkaline Phosphatase: 94 U/L (ref 38–126)
Anion gap: 8 (ref 5–15)
BUN: 18 mg/dL (ref 6–20)
CO2: 24 mmol/L (ref 22–32)
Calcium: 9.5 mg/dL (ref 8.9–10.3)
Chloride: 107 mmol/L (ref 98–111)
Creatinine, Ser: 0.74 mg/dL (ref 0.44–1.00)
GFR, Estimated: >60 mL/min (ref >60)
Glucose, Bld: 86 mg/dL (ref 70–99)
Potassium: 3.3 mmol/L — ABNORMAL LOW (ref 3.5–5.1)
Sodium: 139 mmol/L (ref 135–145)
Total Bilirubin: 0.6 mg/dL (ref 0.3–1.2)
Total Protein: 6.9 g/dL (ref 6.5–8.1)

## 2021-09-12 LAB — URINALYSIS, MICROSCOPIC (REFLEX)

## 2021-09-12 LAB — TYPE AND SCREEN
ABO/RH(D): O POS
Antibody Screen: NEGATIVE

## 2021-09-12 LAB — APTT: aPTT: 28 seconds (ref 24–36)

## 2021-09-12 LAB — SURGICAL PCR SCREEN
MRSA, PCR: NEGATIVE
Staphylococcus aureus: POSITIVE — AB

## 2021-09-12 MED ORDER — TRANEXAMIC ACID 1000 MG/10ML IV SOLN
2000.0000 mg | INTRAVENOUS | Status: DC
  Filled 2021-09-12 (×2): qty 20

## 2021-09-12 MED ORDER — CEFAZOLIN SODIUM-DEXTROSE 2-4 GM/100ML-% IV SOLN: 2.0000 g | INTRAVENOUS | Status: DC

## 2021-09-12 MED ORDER — LACTATED RINGERS IV SOLN: INTRAVENOUS | Status: DC

## 2021-09-12 MED ORDER — TRANEXAMIC ACID-NACL 1000-0.7 MG/100ML-% IV SOLN: 1000.0000 mg | INTRAVENOUS | Status: DC

## 2021-09-12 MED ORDER — POVIDONE-IODINE 10 % EX SWAB: 2.0000 "application " | Freq: Once | CUTANEOUS | Status: DC

## 2021-09-12 NOTE — Progress Notes (Signed)
PCP - none Cardiologist - none  PPM/ICD -denies  Device Orders -  Rep Notified -   Chest x-ray -  EKG - 09/12/21 Stress Test - no ECHO - no Cardiac Cath - no  Sleep Study - pt states she had one many years ago, but was not diagnosed with sleep apnea.  CPAP - no  Fasting Blood Sugar - n/a Checks Blood Sugar _____ times a day  Blood Thinner Instructions:n/a Aspirin Instructions: pt to stop today  ERAS Protcol -yes. Clear liquids until 0430. PRE-SURGERY Ensure or G2- Ensure  COVID TEST- no. Pt provided documentation of positive Covid test on 9/13 so she was not retested. Pt is asymptomatic today.   Anesthesia review: no  Patient denies shortness of breath, fever, cough and chest pain at PAT appointment   All instructions explained to the patient, with a verbal understanding of the material. Patient agrees to go over the instructions while at home for a better understanding. Patient also instructed to self quarantine after being tested for COVID-19. The opportunity to ask questions was provided.

## 2021-09-12 NOTE — Progress Notes (Signed)
Pt did not come for PAT appt at 0800. Called pt at 0820. No answer; I left message stating that I would call back later today with instructions. Gave patient PAT number to call.

## 2021-09-13 MED ORDER — TRANEXAMIC ACID 1000 MG/10ML IV SOLN
2000.0000 mg | INTRAVENOUS | Status: DC
  Filled 2021-09-13: qty 20

## 2021-09-15 ENCOUNTER — Encounter (HOSPITAL_COMMUNITY): Payer: Self-pay | Admitting: Orthopaedic Surgery

## 2021-09-15 NOTE — Anesthesia Preprocedure Evaluation (Addendum)
Anesthesia Evaluation  Patient identified by MRN, date of birth, ID band Patient awake    Reviewed: Allergy & Precautions, NPO status , Patient's Chart, lab work & pertinent test results, reviewed documented beta blocker date and time   Airway Mallampati: II  TM Distance: >3 FB Neck ROM: Full    Dental  (+) Teeth Intact, Dental Advisory Given, Caps   Pulmonary  Covid 2 weeks ago, recovered   Pulmonary exam normal breath sounds clear to auscultation       Cardiovascular hypertension, Pt. on medications Normal cardiovascular exam Rhythm:Regular Rate:Normal     Neuro/Psych  Headaches, PSYCHIATRIC DISORDERS Anxiety Depression ADHD Neuromuscular disease    GI/Hepatic negative GI ROS, Neg liver ROS,   Endo/Other  negative endocrine ROS  Renal/GU negative Renal ROS  negative genitourinary   Musculoskeletal  (+) Arthritis , Osteoarthritis,  DJD left hip   Abdominal   Peds  Hematology negative hematology ROS (+)   Anesthesia Other Findings   Reproductive/Obstetrics                           Anesthesia Physical Anesthesia Plan  ASA: 2  Anesthesia Plan: Spinal   Post-op Pain Management:    Induction: Intravenous  PONV Risk Score and Plan: 3 and Treatment may vary due to age or medical condition, Dexamethasone, Midazolam, Scopolamine patch - Pre-op and Ondansetron  Airway Management Planned: Natural Airway and Simple Face Mask  Additional Equipment:   Intra-op Plan:   Post-operative Plan:   Informed Consent: I have reviewed the patients History and Physical, chart, labs and discussed the procedure including the risks, benefits and alternatives for the proposed anesthesia with the patient or authorized representative who has indicated his/her understanding and acceptance.     Dental advisory given  Plan Discussed with: CRNA and Anesthesiologist  Anesthesia Plan Comments:         Anesthesia Quick Evaluation

## 2021-09-16 ENCOUNTER — Ambulatory Visit (HOSPITAL_COMMUNITY): Payer: BC Managed Care – PPO | Admitting: Anesthesiology

## 2021-09-16 ENCOUNTER — Observation Stay (HOSPITAL_COMMUNITY): Payer: BC Managed Care – PPO

## 2021-09-16 ENCOUNTER — Encounter (HOSPITAL_COMMUNITY): Payer: Self-pay | Admitting: Orthopaedic Surgery

## 2021-09-16 ENCOUNTER — Ambulatory Visit (HOSPITAL_COMMUNITY): Payer: BC Managed Care – PPO

## 2021-09-16 ENCOUNTER — Other Ambulatory Visit: Payer: Self-pay

## 2021-09-16 ENCOUNTER — Observation Stay (HOSPITAL_COMMUNITY)
Admission: RE | Admit: 2021-09-16 | Discharge: 2021-09-17 | Disposition: A | Discharge status: Home / Self Care | Payer: BC Managed Care – PPO | Attending: Orthopaedic Surgery | Admitting: Orthopaedic Surgery

## 2021-09-16 ENCOUNTER — Encounter (HOSPITAL_COMMUNITY)
Admission: RE | Disposition: A | Discharge status: Home / Self Care | Payer: Self-pay | Source: Home / Self Care | Attending: Orthopaedic Surgery

## 2021-09-16 DIAGNOSIS — Z7982 Long term (current) use of aspirin: Secondary | ICD-10-CM | POA: Insufficient documentation

## 2021-09-16 DIAGNOSIS — Z96642 Presence of left artificial hip joint: Secondary | ICD-10-CM

## 2021-09-16 DIAGNOSIS — I10 Essential (primary) hypertension: Secondary | ICD-10-CM | POA: Diagnosis not present

## 2021-09-16 DIAGNOSIS — F418 Other specified anxiety disorders: Secondary | ICD-10-CM | POA: Diagnosis not present

## 2021-09-16 DIAGNOSIS — Z79899 Other long term (current) drug therapy: Secondary | ICD-10-CM | POA: Insufficient documentation

## 2021-09-16 DIAGNOSIS — F909 Attention-deficit hyperactivity disorder, unspecified type: Secondary | ICD-10-CM | POA: Diagnosis not present

## 2021-09-16 DIAGNOSIS — Z96649 Presence of unspecified artificial hip joint: Secondary | ICD-10-CM

## 2021-09-16 DIAGNOSIS — Z471 Aftercare following joint replacement surgery: Secondary | ICD-10-CM | POA: Diagnosis not present

## 2021-09-16 DIAGNOSIS — M1612 Unilateral primary osteoarthritis, left hip: Secondary | ICD-10-CM | POA: Diagnosis not present

## 2021-09-16 DIAGNOSIS — Z419 Encounter for procedure for purposes other than remedying health state, unspecified: Secondary | ICD-10-CM

## 2021-09-16 HISTORY — PX: TOTAL HIP ARTHROPLASTY: SHX124

## 2021-09-16 LAB — ABO/RH: ABO/RH(D): O POS

## 2021-09-16 LAB — POCT PREGNANCY, URINE: Preg Test, Ur: NEGATIVE

## 2021-09-16 SURGERY — ARTHROPLASTY, HIP, TOTAL, ANTERIOR APPROACH
Anesthesia: Spinal | Site: Hip | Laterality: Left

## 2021-09-16 MED ORDER — ONDANSETRON HCL 4 MG/2ML IJ SOLN: 4.0000 mg | Freq: Four times a day (QID) | INTRAMUSCULAR | Status: DC | PRN

## 2021-09-16 MED ORDER — DOCUSATE SODIUM 100 MG PO CAPS
100.0000 mg | ORAL_CAPSULE | Freq: Two times a day (BID) | ORAL | Status: DC
  Administered 2021-09-16 – 2021-09-17 (×2): 100 mg via ORAL
  Filled 2021-09-16 (×2): qty 1

## 2021-09-16 MED ORDER — METHOCARBAMOL 500 MG PO TABS
500.0000 mg | ORAL_TABLET | Freq: Four times a day (QID) | ORAL | Status: DC | PRN
  Administered 2021-09-16 – 2021-09-17 (×3): 500 mg via ORAL
  Filled 2021-09-16 (×3): qty 1

## 2021-09-16 MED ORDER — ONDANSETRON HCL 4 MG PO TABS: 4.0000 mg | ORAL_TABLET | Freq: Four times a day (QID) | ORAL | Status: DC | PRN

## 2021-09-16 MED ORDER — OXYCODONE HCL ER 10 MG PO T12A
10.0000 mg | EXTENDED_RELEASE_TABLET | Freq: Two times a day (BID) | ORAL | Status: DC
Start: 2021-09-16 — End: 2021-09-17
  Administered 2021-09-16 – 2021-09-17 (×3): 10 mg via ORAL
  Filled 2021-09-16 (×3): qty 1

## 2021-09-16 MED ORDER — TRANEXAMIC ACID-NACL 1000-0.7 MG/100ML-% IV SOLN
1000.0000 mg | INTRAVENOUS | Status: AC
  Administered 2021-09-16: 1000 mg via INTRAVENOUS
  Filled 2021-09-16: qty 100

## 2021-09-16 MED ORDER — DEXAMETHASONE SODIUM PHOSPHATE 10 MG/ML IJ SOLN
INTRAMUSCULAR | Status: DC | PRN
  Administered 2021-09-16: 5 mg via INTRAVENOUS

## 2021-09-16 MED ORDER — BUPIVACAINE-MELOXICAM ER 400-12 MG/14ML IJ SOLN
INTRAMUSCULAR | Status: DC | PRN
  Administered 2021-09-16: 400 mg

## 2021-09-16 MED ORDER — PHENYLEPHRINE HCL (PRESSORS) 10 MG/ML IV SOLN
INTRAVENOUS | Status: DC | PRN
  Administered 2021-09-16 (×3): 100 ug via INTRAVENOUS

## 2021-09-16 MED ORDER — ONDANSETRON HCL 4 MG/2ML IJ SOLN: 4.0000 mg | Freq: Once | INTRAMUSCULAR | Status: DC | PRN

## 2021-09-16 MED ORDER — SODIUM CHLORIDE 0.9 % IR SOLN
Status: DC | PRN
  Administered 2021-09-16: 1000 mL

## 2021-09-16 MED ORDER — ACETAMINOPHEN 500 MG PO TABS
1000.0000 mg | ORAL_TABLET | Freq: Four times a day (QID) | ORAL | Status: AC
  Administered 2021-09-16 – 2021-09-17 (×3): 1000 mg via ORAL
  Filled 2021-09-16 (×4): qty 2

## 2021-09-16 MED ORDER — OXYCODONE HCL 5 MG/5ML PO SOLN
5.0000 mg | Freq: Once | ORAL | Status: DC | PRN
Start: 2021-09-16 — End: 2021-09-16

## 2021-09-16 MED ORDER — DEXAMETHASONE SODIUM PHOSPHATE 10 MG/ML IJ SOLN
INTRAMUSCULAR | Status: AC
  Filled 2021-09-16: qty 1

## 2021-09-16 MED ORDER — TRAZODONE HCL 50 MG PO TABS
50.0000 mg | ORAL_TABLET | Freq: Every day | ORAL | Status: DC
  Administered 2021-09-16: 50 mg via ORAL
  Filled 2021-09-16: qty 1

## 2021-09-16 MED ORDER — LACTATED RINGERS IV SOLN: INTRAVENOUS | Status: DC

## 2021-09-16 MED ORDER — MENTHOL 3 MG MT LOZG: 1.0000 | LOZENGE | OROMUCOSAL | Status: DC | PRN

## 2021-09-16 MED ORDER — CEFAZOLIN SODIUM-DEXTROSE 2-4 GM/100ML-% IV SOLN
2.0000 g | INTRAVENOUS | Status: AC
  Administered 2021-09-16: 2 g via INTRAVENOUS
  Filled 2021-09-16: qty 100

## 2021-09-16 MED ORDER — MIDAZOLAM HCL 5 MG/5ML IJ SOLN
INTRAMUSCULAR | Status: DC | PRN
  Administered 2021-09-16: 2 mg via INTRAVENOUS

## 2021-09-16 MED ORDER — OXYCODONE HCL 5 MG PO TABS
10.0000 mg | ORAL_TABLET | ORAL | Status: DC | PRN
  Administered 2021-09-16: 10 mg via ORAL

## 2021-09-16 MED ORDER — METOCLOPRAMIDE HCL 5 MG/ML IJ SOLN: 5.0000 mg | Freq: Three times a day (TID) | INTRAMUSCULAR | Status: DC | PRN

## 2021-09-16 MED ORDER — BUPIVACAINE-MELOXICAM ER 400-12 MG/14ML IJ SOLN
INTRAMUSCULAR | Status: AC
  Filled 2021-09-16: qty 1

## 2021-09-16 MED ORDER — FENTANYL CITRATE (PF) 100 MCG/2ML IJ SOLN
INTRAMUSCULAR | Status: DC | PRN
  Administered 2021-09-16 (×2): 50 ug via INTRAVENOUS

## 2021-09-16 MED ORDER — VENLAFAXINE HCL ER 75 MG PO CP24
150.0000 mg | ORAL_CAPSULE | Freq: Every day | ORAL | Status: DC
  Administered 2021-09-17: 150 mg via ORAL
  Filled 2021-09-16: qty 2

## 2021-09-16 MED ORDER — PROPOFOL 500 MG/50ML IV EMUL
INTRAVENOUS | Status: DC | PRN
  Administered 2021-09-16: 50 ug/kg/min via INTRAVENOUS

## 2021-09-16 MED ORDER — POLYETHYLENE GLYCOL 3350 17 G PO PACK
17.0000 g | PACK | Freq: Every day | ORAL | Status: DC
  Administered 2021-09-17: 17 g via ORAL
  Filled 2021-09-16: qty 1

## 2021-09-16 MED ORDER — TRANEXAMIC ACID 1000 MG/10ML IV SOLN
2000.0000 mg | INTRAVENOUS | Status: AC
  Administered 2021-09-16: 2000 mg via TOPICAL
  Filled 2021-09-16: qty 20

## 2021-09-16 MED ORDER — SORBITOL 70 % SOLN: 30.0000 mL | Freq: Every day | Status: DC | PRN

## 2021-09-16 MED ORDER — VENLAFAXINE HCL ER 75 MG PO CP24
75.0000 mg | ORAL_CAPSULE | Freq: Every day | ORAL | Status: DC
  Administered 2021-09-17: 75 mg via ORAL
  Filled 2021-09-16: qty 1

## 2021-09-16 MED ORDER — ALUM & MAG HYDROXIDE-SIMETH 200-200-20 MG/5ML PO SUSP: 30.0000 mL | ORAL | Status: DC | PRN

## 2021-09-16 MED ORDER — CHLORHEXIDINE GLUCONATE 0.12 % MT SOLN
15.0000 mL | Freq: Once | OROMUCOSAL | Status: AC
  Administered 2021-09-16: 15 mL via OROMUCOSAL
  Filled 2021-09-16: qty 15

## 2021-09-16 MED ORDER — PROPOFOL 1000 MG/100ML IV EMUL
INTRAVENOUS | Status: AC
  Filled 2021-09-16: qty 100

## 2021-09-16 MED ORDER — DEXAMETHASONE SODIUM PHOSPHATE 10 MG/ML IJ SOLN
10.0000 mg | Freq: Once | INTRAMUSCULAR | Status: AC
  Administered 2021-09-17: 10 mg via INTRAVENOUS
  Filled 2021-09-16: qty 1

## 2021-09-16 MED ORDER — FENTANYL CITRATE (PF) 250 MCG/5ML IJ SOLN
INTRAMUSCULAR | Status: AC
  Filled 2021-09-16: qty 5

## 2021-09-16 MED ORDER — ORAL CARE MOUTH RINSE: 15.0000 mL | Freq: Once | OROMUCOSAL | Status: AC

## 2021-09-16 MED ORDER — TRANEXAMIC ACID-NACL 1000-0.7 MG/100ML-% IV SOLN
1000.0000 mg | Freq: Once | INTRAVENOUS | Status: AC
  Administered 2021-09-16: 1000 mg via INTRAVENOUS
  Filled 2021-09-16: qty 100

## 2021-09-16 MED ORDER — IRRISEPT - 450ML BOTTLE WITH 0.05% CHG IN STERILE WATER, USP 99.95% OPTIME
TOPICAL | Status: DC | PRN
  Administered 2021-09-16: 450 mL via TOPICAL

## 2021-09-16 MED ORDER — LACTATED RINGERS IV SOLN
INTRAVENOUS | Status: DC
Start: 2021-09-16 — End: 2021-09-16

## 2021-09-16 MED ORDER — AMPHETAMINE-DEXTROAMPHET ER 5 MG PO CP24: 15.0000 mg | ORAL_CAPSULE | Freq: Every day | ORAL | Status: DC

## 2021-09-16 MED ORDER — PROPOFOL 10 MG/ML IV BOLUS
INTRAVENOUS | Status: AC
  Filled 2021-09-16: qty 20

## 2021-09-16 MED ORDER — VANCOMYCIN HCL 1 G IV SOLR
INTRAVENOUS | Status: DC | PRN
  Administered 2021-09-16: 1000 mg via TOPICAL

## 2021-09-16 MED ORDER — POVIDONE-IODINE 10 % EX SWAB
2.0000 "application " | Freq: Once | CUTANEOUS | Status: AC
  Administered 2021-09-16: 2 via TOPICAL

## 2021-09-16 MED ORDER — PHENOL 1.4 % MT LIQD: 1.0000 | OROMUCOSAL | Status: DC | PRN

## 2021-09-16 MED ORDER — ACETAMINOPHEN 325 MG PO TABS
325.0000 mg | ORAL_TABLET | Freq: Four times a day (QID) | ORAL | Status: DC | PRN
  Administered 2021-09-16: 650 mg via ORAL
  Filled 2021-09-16: qty 2

## 2021-09-16 MED ORDER — PANTOPRAZOLE SODIUM 40 MG PO TBEC
40.0000 mg | DELAYED_RELEASE_TABLET | Freq: Every day | ORAL | Status: DC
  Administered 2021-09-17: 40 mg via ORAL
  Filled 2021-09-16: qty 1

## 2021-09-16 MED ORDER — FENTANYL CITRATE (PF) 100 MCG/2ML IJ SOLN: 25.0000 ug | INTRAMUSCULAR | Status: DC | PRN

## 2021-09-16 MED ORDER — VANCOMYCIN HCL 1000 MG IV SOLR
INTRAVENOUS | Status: AC
  Filled 2021-09-16: qty 20

## 2021-09-16 MED ORDER — ASPIRIN 81 MG PO CHEW
81.0000 mg | CHEWABLE_TABLET | Freq: Two times a day (BID) | ORAL | Status: DC
  Administered 2021-09-16 – 2021-09-17 (×2): 81 mg via ORAL
  Filled 2021-09-16 (×2): qty 1

## 2021-09-16 MED ORDER — METOCLOPRAMIDE HCL 5 MG PO TABS: 5.0000 mg | ORAL_TABLET | Freq: Three times a day (TID) | ORAL | Status: DC | PRN

## 2021-09-16 MED ORDER — OXYCODONE HCL 5 MG PO TABS
5.0000 mg | ORAL_TABLET | ORAL | Status: DC | PRN
  Administered 2021-09-16: 5 mg via ORAL
  Administered 2021-09-17: 10 mg via ORAL
  Filled 2021-09-16: qty 2
  Filled 2021-09-16: qty 1
  Filled 2021-09-16: qty 2

## 2021-09-16 MED ORDER — DIPHENHYDRAMINE HCL 12.5 MG/5ML PO ELIX
25.0000 mg | ORAL_SOLUTION | ORAL | Status: DC | PRN
  Filled 2021-09-16: qty 10

## 2021-09-16 MED ORDER — PHENYLEPHRINE 40 MCG/ML (10ML) SYRINGE FOR IV PUSH (FOR BLOOD PRESSURE SUPPORT)
PREFILLED_SYRINGE | INTRAVENOUS | Status: AC
  Filled 2021-09-16: qty 10

## 2021-09-16 MED ORDER — METHOCARBAMOL 1000 MG/10ML IJ SOLN
500.0000 mg | Freq: Four times a day (QID) | INTRAVENOUS | Status: DC | PRN
  Filled 2021-09-16: qty 5

## 2021-09-16 MED ORDER — 0.9 % SODIUM CHLORIDE (POUR BTL) OPTIME
TOPICAL | Status: DC | PRN
  Administered 2021-09-16: 1000 mL

## 2021-09-16 MED ORDER — BUPROPION HCL ER (XL) 150 MG PO TB24
150.0000 mg | ORAL_TABLET | Freq: Every day | ORAL | Status: DC
  Filled 2021-09-16: qty 1

## 2021-09-16 MED ORDER — ONDANSETRON HCL 4 MG/2ML IJ SOLN
INTRAMUSCULAR | Status: DC | PRN
  Administered 2021-09-16: 4 mg via INTRAVENOUS

## 2021-09-16 MED ORDER — BUPIVACAINE IN DEXTROSE 0.75-8.25 % IT SOLN
INTRATHECAL | Status: DC | PRN
  Administered 2021-09-16: 1.6 mL via INTRATHECAL

## 2021-09-16 MED ORDER — ONDANSETRON HCL 4 MG/2ML IJ SOLN
INTRAMUSCULAR | Status: AC
  Filled 2021-09-16: qty 2

## 2021-09-16 MED ORDER — CEFAZOLIN SODIUM-DEXTROSE 2-4 GM/100ML-% IV SOLN
2.0000 g | Freq: Four times a day (QID) | INTRAVENOUS | Status: AC
  Administered 2021-09-16 (×2): 2 g via INTRAVENOUS
  Filled 2021-09-16 (×2): qty 100

## 2021-09-16 MED ORDER — OXYCODONE HCL 5 MG PO TABS
5.0000 mg | ORAL_TABLET | Freq: Once | ORAL | Status: DC | PRN
Start: 2021-09-16 — End: 2021-09-16

## 2021-09-16 MED ORDER — SODIUM CHLORIDE 0.9 % IV SOLN: INTRAVENOUS | Status: DC

## 2021-09-16 MED ORDER — HYDROMORPHONE HCL 1 MG/ML IJ SOLN
0.5000 mg | INTRAMUSCULAR | Status: DC | PRN
  Administered 2021-09-16: 1 mg via INTRAVENOUS
  Filled 2021-09-16: qty 1

## 2021-09-16 MED ORDER — MIDAZOLAM HCL 2 MG/2ML IJ SOLN
INTRAMUSCULAR | Status: AC
  Filled 2021-09-16: qty 2

## 2021-09-16 SURGICAL SUPPLY — 63 items
BAG COUNTER SPONGE SURGICOUNT (BAG) ×2 IMPLANT
BAG DECANTER FOR FLEXI CONT (MISCELLANEOUS) ×2 IMPLANT
BAG SPNG CNTER NS LX DISP (BAG) ×1
CELLS DAT CNTRL 66122 CELL SVR (MISCELLANEOUS) IMPLANT
COVER PERINEAL POST (MISCELLANEOUS) ×2 IMPLANT
COVER SURGICAL LIGHT HANDLE (MISCELLANEOUS) ×2 IMPLANT
CUP SECTOR GRIPTON 50MM (Cup) ×1 IMPLANT
DRAPE C-ARM 42X72 X-RAY (DRAPES) ×2 IMPLANT
DRAPE POUCH INSTRU U-SHP 10X18 (DRAPES) ×2 IMPLANT
DRAPE STERI IOBAN 125X83 (DRAPES) ×2 IMPLANT
DRAPE U-SHAPE 47X51 STRL (DRAPES) ×4 IMPLANT
DRSG AQUACEL AG ADV 3.5X10 (GAUZE/BANDAGES/DRESSINGS) ×2 IMPLANT
DURAPREP 26ML APPLICATOR (WOUND CARE) ×4 IMPLANT
ELECT BLADE 4.0 EZ CLEAN MEGAD (MISCELLANEOUS) ×2
ELECT REM PT RETURN 9FT ADLT (ELECTROSURGICAL) ×2
ELECTRODE BLDE 4.0 EZ CLN MEGD (MISCELLANEOUS) ×1 IMPLANT
ELECTRODE REM PT RTRN 9FT ADLT (ELECTROSURGICAL) ×1 IMPLANT
GLOVE SURG LTX SZ7 (GLOVE) ×4 IMPLANT
GLOVE SURG NEOP MICRO LF SZ7.5 (GLOVE) ×2 IMPLANT
GLOVE SURG SYN 7.5  E (GLOVE) ×8
GLOVE SURG SYN 7.5 E (GLOVE) ×4 IMPLANT
GLOVE SURG SYN 7.5 PF PI (GLOVE) ×4 IMPLANT
GLOVE SURG UNDER POLY LF SZ7 (GLOVE) ×10 IMPLANT
GOWN STRL REIN XL XLG (GOWN DISPOSABLE) ×2 IMPLANT
GOWN STRL REUS W/ TWL LRG LVL3 (GOWN DISPOSABLE) IMPLANT
GOWN STRL REUS W/ TWL XL LVL3 (GOWN DISPOSABLE) ×1 IMPLANT
GOWN STRL REUS W/TWL LRG LVL3 (GOWN DISPOSABLE)
GOWN STRL REUS W/TWL XL LVL3 (GOWN DISPOSABLE) ×2
HANDPIECE INTERPULSE COAX TIP (DISPOSABLE) ×2
HEAD FEMORAL 32 CERAMIC (Hips) ×1 IMPLANT
HOOD PEEL AWAY FLYTE STAYCOOL (MISCELLANEOUS) ×4 IMPLANT
IV NS IRRIG 3000ML ARTHROMATIC (IV SOLUTION) ×2 IMPLANT
JET LAVAGE IRRISEPT WOUND (IRRIGATION / IRRIGATOR) ×2
KIT BASIN OR (CUSTOM PROCEDURE TRAY) ×2 IMPLANT
LAVAGE JET IRRISEPT WOUND (IRRIGATION / IRRIGATOR) ×1 IMPLANT
LINER ACET PNNCL PLUS4 NEUTRAL (Hips) IMPLANT
LINER ACETABULAR 32X50 (Liner) ×1 IMPLANT
MARKER SKIN DUAL TIP RULER LAB (MISCELLANEOUS) ×2 IMPLANT
NDL SPNL 18GX3.5 QUINCKE PK (NEEDLE) ×1 IMPLANT
NEEDLE SPNL 18GX3.5 QUINCKE PK (NEEDLE) ×2 IMPLANT
PACK TOTAL JOINT (CUSTOM PROCEDURE TRAY) ×2 IMPLANT
PACK UNIVERSAL I (CUSTOM PROCEDURE TRAY) ×2 IMPLANT
PINNACLE PLUS 4 NEUTRAL (Hips) ×2 IMPLANT
RETRACTOR WND ALEXIS 18 MED (MISCELLANEOUS) IMPLANT
RTRCTR WOUND ALEXIS 18CM MED (MISCELLANEOUS)
SAW OSC TIP CART 19.5X105X1.3 (SAW) ×2 IMPLANT
SCREW 6.5MMX25MM (Screw) ×1 IMPLANT
SET HNDPC FAN SPRY TIP SCT (DISPOSABLE) ×1 IMPLANT
STAPLER VISISTAT 35W (STAPLE) IMPLANT
STEM FEM ACTIS STD SZ4 (Stem) ×1 IMPLANT
SUT ETHIBOND 2 V 37 (SUTURE) ×2 IMPLANT
SUT VIC AB 0 CT1 27 (SUTURE) ×2
SUT VIC AB 0 CT1 27XBRD ANBCTR (SUTURE) ×1 IMPLANT
SUT VIC AB 1 CTX 36 (SUTURE) ×2
SUT VIC AB 1 CTX36XBRD ANBCTR (SUTURE) ×1 IMPLANT
SUT VIC AB 2-0 CT1 27 (SUTURE) ×4
SUT VIC AB 2-0 CT1 TAPERPNT 27 (SUTURE) ×2 IMPLANT
SYR 50ML LL SCALE MARK (SYRINGE) ×2 IMPLANT
TOWEL GREEN STERILE (TOWEL DISPOSABLE) ×2 IMPLANT
TRAY CATH 16FR W/PLASTIC CATH (SET/KITS/TRAYS/PACK) IMPLANT
TRAY FOLEY W/BAG SLVR 16FR (SET/KITS/TRAYS/PACK) ×2
TRAY FOLEY W/BAG SLVR 16FR ST (SET/KITS/TRAYS/PACK) ×1 IMPLANT
YANKAUER SUCT BULB TIP NO VENT (SUCTIONS) ×2 IMPLANT

## 2021-09-16 NOTE — Transfer of Care (Signed)
Immediate Anesthesia Transfer of Care Note  Patient: Sheila Salazar  Procedure(s) Performed: LEFT TOTAL HIP ARTHROPLASTY ANTERIOR APPROACH (Left: Hip)  Patient Location: PACU  Anesthesia Type:Spinal  Level of Consciousness: awake, alert , oriented and patient cooperative  Airway & Oxygen Therapy: Patient Spontanous Breathing and Patient connected to face mask oxygen  Post-op Assessment: Report given to RN, Post -op Vital signs reviewed and stable and Patient moving all extremities X 4  Post vital signs: Reviewed and stable  Last Vitals:  Vitals Value Taken Time  BP 98/65 09/16/21 0923  Temp    Pulse 65 09/16/21 0923  Resp 13 09/16/21 0923  SpO2 100 % 09/16/21 0923  Vitals shown include unvalidated device data.  Last Pain:  Vitals:   09/16/21 0615  TempSrc:   PainSc: 0-No pain         Complications: No notable events documented.

## 2021-09-16 NOTE — Anesthesia Procedure Notes (Signed)
Spinal  Patient location during procedure: OR Start time: 09/16/2021 7:21 AM End time: 09/16/2021 7:25 AM Reason for block: surgical anesthesia Staffing Performed: anesthesiologist  Anesthesiologist: Mal Amabile, MD Preanesthetic Checklist Completed: patient identified, IV checked, site marked, risks and benefits discussed, surgical consent, monitors and equipment checked, pre-op evaluation and timeout performed Spinal Block Patient position: sitting Prep: DuraPrep and site prepped and draped Patient monitoring: heart rate, cardiac monitor, continuous pulse ox and blood pressure Approach: midline Location: L3-4 Injection technique: single-shot Needle Needle type: Pencan  Needle gauge: 24 G Needle length: 9 cm Needle insertion depth: 4.5 cm Assessment Sensory level: T4 Events: CSF return Additional Notes Patient tolerated procedure well. Adequate sensory level.

## 2021-09-16 NOTE — H&P (Signed)
PREOPERATIVE H&P  Chief Complaint: left hip degenerative joint disease  HPI: Sheila Salazar is a 55 y.o. female who presents for surgical treatment of left hip degenerative joint disease.  She denies any changes in medical history.  Past Medical History:  Diagnosis Date   ADHD (attention deficit hyperactivity disorder)    Allergy    Anxiety    Depression    Headache    Hypertension    Past Surgical History:  Procedure Laterality Date   EYE SURGERY     Social History   Socioeconomic History   Marital status: Single    Spouse name: Not on file   Number of children: Not on file   Years of education: Not on file   Highest education level: Not on file  Occupational History   Not on file  Tobacco Use   Smoking status: Never   Smokeless tobacco: Never  Vaping Use   Vaping Use: Never used  Substance and Sexual Activity   Alcohol use: Yes    Comment: occasionally   Drug use: Not on file   Sexual activity: Not on file  Other Topics Concern   Not on file  Social History Narrative   Not on file   Social Determinants of Health   Financial Resource Strain: Not on file  Food Insecurity: Not on file  Transportation Needs: Not on file  Physical Activity: Not on file  Stress: Not on file  Social Connections: Not on file   Family History  Problem Relation Age of Onset   Breast cancer Mother    Allergies  Allergen Reactions   Erythromycin     Stomach cramps    Sulfa Antibiotics Rash   Prior to Admission medications   Medication Sig Start Date End Date Taking? Authorizing Provider  acetaminophen (TYLENOL) 500 MG tablet Take 500 mg by mouth every 6 (six) hours as needed for moderate pain.   Yes [provider]  amLODipine (NORVASC) 10 MG tablet Take 10 mg by mouth daily. 05/18/14  Yes [provider]  amphetamine-dextroamphetamine (ADDERALL XR) 15 MG 24 hr capsule Take 15 mg by mouth daily. 08/13/21  Yes [provider]  BORON PO Take 60 mg  by mouth daily.   Yes [provider]  buPROPion (WELLBUTRIN XL) 150 MG 24 hr tablet Take 150 mg by mouth daily. 08/05/21  Yes [provider]  Diclofenac-miSOPROStol 75-0.2 MG TBEC Take 1 tablet by mouth daily. 08/27/21  Yes [provider]  EMGALITY 120 MG/ML SOAJ Inject 120 mg into the skin every 30 (thirty) days. 08/19/21  Yes [provider]  estradiol (ESTRACE) 1 MG tablet Take 1 mg by mouth daily.   Yes [provider]  ibuprofen (ADVIL) 200 MG tablet Take 400 mg by mouth every 6 (six) hours as needed.   Yes [provider]  L-Proline 500 MG CAPS Take 500 mg by mouth daily.   Yes [provider]  Magnesium 400 MG TABS Take 400 mg by mouth daily.   Yes [provider]  Multiple Vitamins-Minerals (ZINC PO) Take 60 mg by mouth daily.   Yes [provider]  Taurine 1000 MG CAPS Take 1,000 mg by mouth daily.   Yes [provider]  traZODone (DESYREL) 50 MG tablet Take 50 mg by mouth at bedtime.   Yes [provider]  venlafaxine XR (EFFEXOR-XR) 150 MG 24 hr capsule Take 150 mg by mouth daily. Take with 75 mg to equal 225 mg  daily 04/12/21  Yes [provider]  venlafaxine XR (EFFEXOR-XR) 75 MG 24 hr capsule Take 75 mg by mouth daily. Take with 150 mg to equal 225 mg daily 08/27/21  Yes [provider]  aspirin EC 81 MG tablet Take 1 tablet (81 mg total) by mouth 2 (two) times daily. To be taken after surgery 09/11/21   Cristie Hem, PA-C  docusate sodium (COLACE) 100 MG capsule Take 1 capsule (100 mg total) by mouth daily as needed. 09/11/21 09/11/22  Cristie Hem, PA-C  methocarbamol (ROBAXIN) 500 MG tablet Take 1 tablet (500 mg total) by mouth 2 (two) times daily as needed. To be taken after surgery 09/11/21   Cristie Hem, PA-C  ondansetron (ZOFRAN) 4 MG tablet Take 1 tablet (4 mg total) by mouth every 8 (eight) hours as needed for nausea or vomiting. 09/11/21   Cristie Hem,  PA-C  oxyCODONE-acetaminophen (PERCOCET) 5-325 MG tablet Take 1-2 tablets by mouth every 6 (six) hours as needed. To be taken after surgery 09/11/21   Cristie Hem, PA-C  traMADol (ULTRAM) 50 MG tablet Take 1 tablet (50 mg total) by mouth 3 (three) times daily as needed. Patient not taking: No sig reported 07/05/21   Cristie Hem, PA-C     Positive ROS: All other systems have been reviewed and were otherwise negative with the exception of those mentioned in the HPI and as above.  Physical Exam: General: Alert, no acute distress Cardiovascular: No pedal edema Respiratory: No cyanosis, no use of accessory musculature GI: abdomen soft Skin: No lesions in the area of chief complaint Neurologic: Sensation intact distally Psychiatric: Patient is competent for consent with normal mood and affect Lymphatic: no lymphedema  MUSCULOSKELETAL: exam stable  Assessment: left hip degenerative joint disease  Plan: Plan for Procedure(s): LEFT TOTAL HIP ARTHROPLASTY ANTERIOR APPROACH  The risks benefits and alternatives were discussed with the patient including but not limited to the risks of nonoperative treatment, versus surgical intervention including infection, bleeding, nerve injury,  blood clots, cardiopulmonary complications, morbidity, mortality, among others, and they were willing to proceed.   Preoperative templating of the joint replacement has been completed, documented, and submitted to the Operating Room personnel in order to optimize intra-operative equipment management.   Glee Arvin, MD 09/16/2021 5:46 AM

## 2021-09-16 NOTE — Anesthesia Postprocedure Evaluation (Signed)
Anesthesia Post Note  Patient: [name]  Procedure(s) Performed: LEFT TOTAL HIP ARTHROPLASTY ANTERIOR APPROACH (Left: Hip)     Patient location during evaluation: PACU Anesthesia Type: Spinal Level of consciousness: oriented and awake and alert Pain management: pain level controlled Vital Signs Assessment: post-procedure vital signs reviewed and stable Respiratory status: spontaneous breathing, respiratory function stable and nonlabored ventilation Cardiovascular status: blood pressure returned to baseline and stable Postop Assessment: no headache, no backache, no apparent nausea or vomiting, spinal receding and patient able to bend at knees Anesthetic complications: no   No notable events documented.  Last Vitals:  Vitals:   09/16/21 1010 09/16/21 1018  BP: 94/60 98/66  Pulse: 66 71  Resp: 10 14  Temp: 36.4 C 36.4 C  SpO2: 100% 96%    Last Pain:  Vitals:   09/16/21 1018  TempSrc:   PainSc: 0-No pain                 Jasira Robinson A.

## 2021-09-16 NOTE — Op Note (Signed)
LEFT TOTAL HIP ARTHROPLASTY ANTERIOR APPROACH  Procedure Note Lorma Heater   [id number]  Pre-op Diagnosis: left hip degenerative joint disease     Post-op Diagnosis: same   Operative Procedures  1. Total hip replacement; Left hip; uncemented [id number]   Surgeon: Gershon Mussel, M.D.  Assist: Oneal Grout, PA-C   Anesthesia: spinal, local  Prosthesis: Depuy Acetabulum: Pinnacle 50 mm Femur: Actis 4 STD Head: 32 mm size: +1 Liner: +0 Bearing Type: ceramic/poly  Total Hip Arthroplasty (Anterior Approach) Op Note:  After informed consent was obtained and the operative extremity marked in the holding area, the patient was brought back to the operating room and placed supine on the HANA table. Next, the operative extremity was prepped and draped in normal sterile fashion. Surgical timeout occurred verifying patient identification, surgical site, surgical procedure and administration of antibiotics.  A modified anterior Smith-Peterson approach to the hip was performed, using the interval between tensor fascia lata and sartorius.  Dissection was carried bluntly down onto the anterior hip capsule. The lateral femoral circumflex vessels were identified and coagulated. A capsulotomy was performed and the capsular flaps tagged for later repair.  There was a moderate joint effusion.  The neck osteotomy was performed. The femoral head was removed which showed severe wear of the weight bearing surface, the acetabular rim was cleared of soft tissue and attention was turned to reaming the acetabulum.  Sequential reaming was performed under fluoroscopic guidance. We reamed to a size 49 mm, and then impacted the acetabular shell. A 25 mm cancellous screw was placed through the shell for added fixation.  The liner was then placed after irrigation and attention turned to the femur.  After placing the femoral hook, the leg was taken to externally rotated, extended and adducted position taking care to  perform soft tissue releases to allow for adequate mobilization of the femur. Soft tissue was cleared from the shoulder of the greater trochanter and the hook elevator used to improve exposure of the proximal femur. Sequential broaching performed up to a size 4. Trial neck and head were placed. The leg was brought back up to neutral and the construct reduced.  Antibiotic irrigation was placed in the surgical wound and kept for at least 1 minute.  The position and sizing of components, offset and leg lengths were checked using fluoroscopy. Stability of the construct was checked in extension and external rotation without any subluxation or impingement of prosthesis. We dislocated the prosthesis, dropped the leg back into position, removed trial components, and irrigated copiously. The final stem and head was then placed, the leg brought back up, the system reduced and fluoroscopy used to verify positioning.  We irrigated, obtained hemostasis and closed the capsule using #2 ethibond suture.  One gram of vancomycin powder was placed in the surgical bed.   One gram of topical tranexamic acid was injected into the joint.  The fascia was closed with #1 vicryl plus, the deep fat layer was closed with 0 vicryl, the subcutaneous layers closed with 2.0 Vicryl Plus and the skin closed with 2.0 nylon and dermabond. A sterile dressing was applied. The patient was awakened in the operating room and taken to recovery in stable condition.  All sponge, needle, and instrument counts were correct at the end of the case.   Tessa Lerner, my PA, was a medical necessity for opening, closing, limb positioning, retracting, exposing, and overall facilitation and timely completion of the surgery.  Position: supine  Complications: see description of  procedure.  Time Out: performed   Drains/Packing: none  Estimated blood loss: see anesthesia record  Returned to Recovery Room: in good condition.   Antibiotics: yes    Mechanical VTE (DVT) Prophylaxis: sequential compression devices, TED thigh-high  Chemical VTE (DVT) Prophylaxis: aspirin   Fluid Replacement: see anesthesia record  Specimens Removed: 1 to pathology   Sponge and Instrument Count Correct? yes   PACU: portable radiograph - low AP   Plan/RTC: Return in 2 weeks for staple removal. Weight Bearing/Load Lower Extremity: full  Hip precautions: none Suture Removal: 2 weeks   N. Glee Arvin, MD [facility] 8:56 AM   Implant Name Type Inv. Item Serial No. Manufacturer Lot No. LRB No. Used Action  CUP SECTOR GRIPTON - [id number] Cup CUP SECTOR GRIPTON  [employer] [id number] Left 1 Implanted  SCREW 6.5MMX25MM - [id number] Screw SCREW 6.5MMX25MM  [employer] [id number] Left 1 Implanted  PINNACLE PLUS 4 NEUTRAL - [id number] Hips PINNACLE PLUS 4 NEUTRAL  [employer] [id number] Left 1 Implanted and Explanted  HEAD FEMORAL 32 CERAMIC - [id number] Hips HEAD FEMORAL 32 CERAMIC  [employer] [id number] Left 1 Implanted  STEM FEM ACTIS STD SZ4 - [id number] Stem STEM FEM ACTIS STD SZ4  [employer] [id number] Left 1 Implanted  LINER ACETABULAR 32X50 - [id number] Liner LINER ACETABULAR 32X50  [employer] [id number] Left 1 Implanted

## 2021-09-16 NOTE — Discharge Instructions (Signed)

## 2021-09-16 NOTE — Evaluation (Signed)
Physical Therapy Evaluation Patient Details Name: Sheila Salazar MRN: [id number] DOB: [birth year] Today's Date: 09/16/2021  History of Present Illness  Patient is a 55 y/o female admitteed for L hip DJD now s/p direct anterior THA.  PMH positive for HTN, depression, ADHD, eye surgery, anxiety.  Clinical Impression  Patient presents with mobility at supervision level without need for further acute level PT.  Reports she will have friends in to assist at d/c and that PT to come see her tomorrow at home.  PT to sign off.        Recommendations for follow up therapy are one component of a multi-disciplinary discharge planning process, led by the attending physician.  Recommendations may be updated based on patient status, additional functional criteria and insurance authorization.  Follow Up Recommendations Follow surgeon's recommendation for DC plan and follow-up therapies    Equipment Recommendations  None recommended by PT    Recommendations for Other Services       Precautions / Restrictions Precautions Precautions: Fall Restrictions LLE Weight Bearing: Weight bearing as tolerated      Mobility  Bed Mobility   Bed Mobility: Supine to Sit     Supine to sit: HOB elevated;Supervision     General bed mobility comments: increased time with some pain in hip but no physical help needed    Transfers     Transfers: Sit to/from Stand Sit to Stand: Supervision         General transfer comment: S for safety, but no physical help needed  Ambulation/Gait Ambulation/Gait assistance: Supervision;Modified independent (Device/Increase time) Gait Distance (Feet): 300 Feet Assistive device: Rolling walker (2 wheeled) Gait Pattern/deviations: Step-through pattern;Decreased stride length     General Gait Details: ambulated in hallway with RW, able to walk somein room without AD, to bathroom first after RN removed foley  Stairs            Wheelchair Mobility    Modified  Rankin (Stroke Patients Only)       Balance Overall balance assessment: Mild deficits observed, not formally tested   Sitting balance-Leahy Scale: Good       Standing balance-Leahy Scale: Good Standing balance comment: washing hands at sink without UE support                             Pertinent Vitals/Pain Pain Assessment: Faces Faces Pain Scale: Hurts whole lot Pain Location: L hip with certain movements Pain Descriptors / Indicators: Aching;Grimacing;Guarding Pain Intervention(s): Repositioned;Monitored during session;Ice applied    Home Living Family/patient expects to be discharged to:: Private residence Living Arrangements: Alone Available Help at Discharge: Friend(s) (they have a schedule for who will stay with me) Type of Home: Apartment Home Access: Elevator     Home Layout: One level Home Equipment: Walker - 2 wheels Additional Comments: friend loaned her a walker    Prior Function Level of Independence: Independent               Hand Dominance        Extremity/Trunk Assessment   Upper Extremity Assessment Upper Extremity Assessment: Overall WFL for tasks assessed    Lower Extremity Assessment Lower Extremity Assessment: LLE deficits/detail LLE Deficits / Details: lifts antigravity without difficulty, but c/o pain       Communication   Communication: No difficulties  Cognition Arousal/Alertness: Awake/alert Behavior During Therapy: WFL for tasks assessed/performed Overall Cognitive Status: Within Functional Limits for tasks assessed  General Comments General comments (skin integrity, edema, etc.): Educated in safety with tub transfers and car transfers, also demonstrated therex to perform, but pt reports called by PT prior to admission and they will come to see her tomorrow.    Exercises Total Joint Exercises Ankle Circles/Pumps: AROM;5 reps;Seated   Assessment/Plan     PT Assessment Patent does not need any further PT services  PT Problem List         PT Treatment Interventions      PT Goals (Current goals can be found in the Care Plan section)  Acute Rehab PT Goals PT Goal Formulation: All assessment and education complete, DC therapy    Frequency     Barriers to discharge        Co-evaluation               AM-PAC PT "6 Clicks" Mobility  Outcome Measure Help needed turning from your back to your side while in a flat bed without using bedrails?: A Little Help needed moving from lying on your back to sitting on the side of a flat bed without using bedrails?: None Help needed moving to and from a bed to a chair (including a wheelchair)?: None Help needed standing up from a chair using your arms (e.g., wheelchair or bedside chair)?: None Help needed to walk in hospital room?: None Help needed climbing 3-5 steps with a railing? : A Little 6 Click Score: 22    End of Session   Activity Tolerance: Patient tolerated treatment well Patient left: with call bell/phone within reach;in chair Nurse Communication: Mobility status PT Visit Diagnosis: Other abnormalities of gait and mobility (R26.89)    Time: 9518-8416 PT Time Calculation (min) (ACUTE ONLY): 28 min   Charges:   PT Evaluation $PT Eval Low Complexity: 1 Low PT Treatments $Gait Training: 8-22 mins        Sheran Lawless, PT Acute Rehabilitation Services Pager:[phone] [phone] 09/16/2021   Sheila Salazar 09/16/2021, 4:28 PM

## 2021-09-17 ENCOUNTER — Encounter (HOSPITAL_COMMUNITY): Payer: Self-pay | Admitting: Orthopaedic Surgery

## 2021-09-17 ENCOUNTER — Other Ambulatory Visit: Payer: Self-pay | Admitting: Physician Assistant

## 2021-09-17 DIAGNOSIS — I10 Essential (primary) hypertension: Secondary | ICD-10-CM | POA: Diagnosis not present

## 2021-09-17 DIAGNOSIS — Z79899 Other long term (current) drug therapy: Secondary | ICD-10-CM | POA: Diagnosis not present

## 2021-09-17 DIAGNOSIS — Z7982 Long term (current) use of aspirin: Secondary | ICD-10-CM | POA: Diagnosis not present

## 2021-09-17 DIAGNOSIS — M1612 Unilateral primary osteoarthritis, left hip: Secondary | ICD-10-CM | POA: Diagnosis not present

## 2021-09-17 LAB — CBC
HCT: 30.8 % — ABNORMAL LOW (ref 36.0–46.0)
Hemoglobin: 10 g/dL — ABNORMAL LOW (ref 12.0–15.0)
MCH: 28.9 pg (ref 26.0–34.0)
MCHC: 32.5 g/dL (ref 30.0–36.0)
MCV: 89 fL (ref 80.0–100.0)
Platelets: 351 10*3/uL (ref 150–400)
RBC: 3.46 MIL/uL — ABNORMAL LOW (ref 3.87–5.11)
RDW: 13.3 % (ref 11.5–15.5)
WBC: 11 10*3/uL — ABNORMAL HIGH (ref 4.0–10.5)
nRBC: 0 % (ref 0.0–0.2)

## 2021-09-17 LAB — BASIC METABOLIC PANEL
Anion gap: 5 (ref 5–15)
BUN: 10 mg/dL (ref 6–20)
CO2: 24 mmol/L (ref 22–32)
Calcium: 8.5 mg/dL — ABNORMAL LOW (ref 8.9–10.3)
Chloride: 107 mmol/L (ref 98–111)
Creatinine, Ser: 0.67 mg/dL (ref 0.44–1.00)
GFR, Estimated: >60 mL/min (ref >60)
Glucose, Bld: 111 mg/dL — ABNORMAL HIGH (ref 70–99)
Potassium: 4.1 mmol/L (ref 3.5–5.1)
Sodium: 136 mmol/L (ref 135–145)

## 2021-09-17 MED ORDER — LINACLOTIDE 145 MCG PO CAPS: 145.0000 ug | ORAL_CAPSULE | Freq: Every day | ORAL | 1 refills | Status: DC

## 2021-09-17 NOTE — Progress Notes (Signed)
Subjective: 1 Day Post-Op Procedure(s) (LRB): LEFT TOTAL HIP ARTHROPLASTY ANTERIOR APPROACH (Left) Patient reports pain as mild.    Objective: Vital signs in last 24 hours: Temp:  [97.7 F (36.5 C)-98.1 F (36.7 C)] 98.1 F (36.7 C) (09/27 0736) Pulse Rate:  [68-89] 89 (09/27 0736) Resp:  [16-18] 16 (09/27 0736) BP: (92-114)/(66-85) 94/73 (09/27 0736) SpO2:  [94 %-100 %] 97 % (09/27 0736)  Intake/Output from previous day: 09/26 0701 - 09/27 0700 In: 900 [I.V.:700; IV Piggyback:200] Out: 2050 [Urine:1850; Blood:200] Intake/Output this shift: No intake/output data recorded.  Recent Labs    09/17/21 0706  HGB 10.0*   Recent Labs    09/17/21 0706  WBC 11.0*  RBC 3.46*  HCT 30.8*  PLT 351   Recent Labs    09/17/21 0706  NA 136  K 4.1  CL 107  CO2 24  BUN 10  CREATININE 0.67  GLUCOSE 111*  CALCIUM 8.5*   No results for input(s): LABPT, INR in the last 72 hours.  Neurologically intact Neurovascular intact Sensation intact distally Intact pulses distally Dorsiflexion/Plantar flexion intact Incision: dressing C/D/I No cellulitis present Compartment soft   Assessment/Plan: 1 Day Post-Op Procedure(s) (LRB): LEFT TOTAL HIP ARTHROPLASTY ANTERIOR APPROACH (Left) Advance diet Up with therapy D/C IV fluids Discharge home with home health      Cristie Hem 09/17/2021, 10:28 AM

## 2021-09-17 NOTE — Discharge Summary (Signed)
Patient ID: Sheila Salazar MRN: [id number] DOB/AGE: 06-16-1966 55 y.o.  Admit date: 09/16/2021 Discharge date: 09/17/2021  Admission Diagnoses:  Principal Problem:   Primary osteoarthritis of left hip Active Problems:   Status post total replacement of left hip   Discharge Diagnoses:  Same  Past Medical History:  Diagnosis Date   ADHD (attention deficit hyperactivity disorder)    Allergy    Anxiety    Depression    Headache    Hypertension     Surgeries: Procedure(s): LEFT TOTAL HIP ARTHROPLASTY ANTERIOR APPROACH on 09/16/2021   Consultants:   Discharged Condition: Improved  Hospital Course: Mairely Foxworth is an 55 y.o. female who was admitted 09/16/2021 for operative treatment ofPrimary osteoarthritis of left hip. Patient has severe unremitting pain that affects sleep, daily activities, and work/hobbies. After pre-op clearance the patient was taken to the operating room on 09/16/2021 and underwent  Procedure(s): LEFT TOTAL HIP ARTHROPLASTY ANTERIOR APPROACH.    Patient was given perioperative antibiotics:  Anti-infectives (From admission, onward)    Start     Dose/Rate Route Frequency Ordered Stop   09/16/21 1330  ceFAZolin (ANCEF) IVPB 2g/100 mL premix        2 g 200 mL/hr over 30 Minutes Intravenous Every 6 hours 09/16/21 1042 09/16/21 2012   09/16/21 0709  vancomycin (VANCOCIN) powder  Status:  Discontinued          As needed 09/16/21 0709 09/16/21 0917   09/16/21 0600  ceFAZolin (ANCEF) IVPB 2g/100 mL premix        2 g 200 mL/hr over 30 Minutes Intravenous On call to O.R. 09/16/21 0557 09/16/21 0740        Patient was given sequential compression devices, early ambulation, and chemoprophylaxis to prevent DVT.  Patient benefited maximally from hospital stay and there were no complications.    Recent vital signs: Patient Vitals for the past 24 hrs:  BP Temp Temp src Pulse Resp SpO2  09/17/21 0736 94/73 98.1 F (36.7 C) Oral 89 16 97 %  09/17/21 0529 114/79 97.9  F (36.6 C) Oral 78 16 100 %  09/16/21 2319 92/68 98.1 F (36.7 C) Oral 79 18 94 %  09/16/21 1954 99/66 97.7 F (36.5 C) Oral 68 18 97 %  09/16/21 1550 107/85 97.9 F (36.6 C) Oral 88 16 99 %  09/16/21 1039 102/73 97.7 F (36.5 C) Oral 73 18 99 %     Recent laboratory studies:  Recent Labs    09/17/21 0706  WBC 11.0*  HGB 10.0*  HCT 30.8*  PLT 351  NA 136  K 4.1  CL 107  CO2 24  BUN 10  CREATININE 0.67  GLUCOSE 111*  CALCIUM 8.5*     Discharge Medications:   Allergies as of 09/17/2021       Reactions   Erythromycin    Stomach cramps    Sulfa Antibiotics Rash        Medication List     STOP taking these medications    acetaminophen 500 MG tablet Commonly known as: TYLENOL   Diclofenac-miSOPROStol 75-0.2 MG Tbec   ibuprofen 200 MG tablet Commonly known as: ADVIL   traMADol 50 MG tablet Commonly known as: ULTRAM       TAKE these medications    amLODipine 10 MG tablet Commonly known as: NORVASC Take 10 mg by mouth daily.   amphetamine-dextroamphetamine 15 MG 24 hr capsule Commonly known as: ADDERALL XR Take 15 mg by mouth daily.   aspirin EC 81  MG tablet Take 1 tablet (81 mg total) by mouth 2 (two) times daily. To be taken after surgery   BORON PO Take 60 mg by mouth daily.   buPROPion 150 MG 24 hr tablet Commonly known as: WELLBUTRIN XL Take 150 mg by mouth daily.   docusate sodium 100 MG capsule Commonly known as: Colace Take 1 capsule (100 mg total) by mouth daily as needed.   Emgality 120 MG/ML Soaj Generic drug: Galcanezumab-gnlm Inject 120 mg into the skin every 30 (thirty) days.   estradiol 1 MG tablet Commonly known as: ESTRACE Take 1 mg by mouth daily.   L-Proline 500 MG Caps Take 500 mg by mouth daily.   linaclotide 145 MCG Caps capsule Commonly known as: Linzess Take 1 capsule (145 mcg total) by mouth daily before breakfast.   Magnesium 400 MG Tabs Take 400 mg by mouth daily.   methocarbamol 500 MG  tablet Commonly known as: Robaxin Take 1 tablet (500 mg total) by mouth 2 (two) times daily as needed. To be taken after surgery   ondansetron 4 MG tablet Commonly known as: Zofran Take 1 tablet (4 mg total) by mouth every 8 (eight) hours as needed for nausea or vomiting.   oxyCODONE-acetaminophen 5-325 MG tablet Commonly known as: Percocet Take 1-2 tablets by mouth every 6 (six) hours as needed. To be taken after surgery   Taurine 1000 MG Caps Take 1,000 mg by mouth daily.   traZODone 50 MG tablet Commonly known as: DESYREL Take 50 mg by mouth at bedtime.   venlafaxine XR 150 MG 24 hr capsule Commonly known as: EFFEXOR-XR Take 150 mg by mouth daily. Take with 75 mg to equal 225 mg daily   venlafaxine XR 75 MG 24 hr capsule Commonly known as: EFFEXOR-XR Take 75 mg by mouth daily. Take with 150 mg to equal 225 mg daily   ZINC PO Take 60 mg by mouth daily.               Durable Medical Equipment  (From admission, onward)           Start     Ordered   09/16/21 1043  DME Walker rolling  Once       Question:  Patient needs a walker to treat with the following condition  Answer:  History of hip replacement   09/16/21 1042   09/16/21 1043  DME 3 n 1  Once        09/16/21 1042   09/16/21 1043  DME Bedside commode  Once       Question:  Patient needs a bedside commode to treat with the following condition  Answer:  History of hip replacement   09/16/21 1042            Diagnostic Studies: DG Pelvis Portable  Result Date: 09/16/2021 CLINICAL DATA:  Left hip arthroplasty. EXAM: PORTABLE PELVIS 1-2 VIEWS COMPARISON:  07/05/2021. FINDINGS: Status post left total hip arthroplasty. The hardware components are in anatomic alignment. No signs of periprosthetic fracture or dislocation. IUD is noted within the central portion of the pelvis. IMPRESSION: Status post left total hip arthroplasty. Electronically Signed   By: Signa Kell M.D.   On: 09/16/2021 13:14   DG  C-Arm 1-60 Min-No Report  Result Date: 09/16/2021 Fluoroscopy was utilized by the requesting physician.  No radiographic interpretation.   DG C-Arm 1-60 Min-No Report  Result Date: 09/16/2021 Fluoroscopy was utilized by the requesting physician.  No radiographic interpretation.   DG  HIP OPERATIVE UNILAT WITH PELVIS LEFT  Result Date: 09/16/2021 CLINICAL DATA:  55 year old female undergoing left hip arthroplasty. EXAM: OPERATIVE LEFT HIP (WITH PELVIS IF PERFORMED) 2 VIEWS TECHNIQUE: Fluoroscopic spot image(s) were submitted for interpretation post-operatively. COMPARISON:  Left hip series 07/05/2021. FINDINGS: 2 intraoperative AP fluoroscopic spot views of the lower pelvis and left hip demonstrate left side total hip arthroplasty hardware. Normal AP alignment. Hardware appears intact. No adverse features. IUD redemonstrated in the central pelvis. FLUOROSCOPY TIME:  0 minutes 26 seconds IMPRESSION: Left total hip arthroplasty with no adverse features. Electronically Signed   By: Odessa Fleming M.D.   On: 09/16/2021 10:01    Disposition: Discharge disposition: 01-Home or Self Care          Follow-up Information     Tarry Kos, MD. Schedule an appointment as soon as possible for a visit in 2 week(s).   Specialty: Orthopedic Surgery Contact information: [address] [place] [address] [phone]                  Signed: Cristie Hem 09/17/2021, 10:30 AM

## 2021-09-17 NOTE — Progress Notes (Signed)
Patient was transported via wheelchair by volunteer for discharge home; in no acute distress nor complaints of pain nor discomfort; hydrocolloid dressing on her left hip was clean, dry and intact; room was checked and accounted for all her belongings; discharge instructions concerning her medications, follow up appointment, wound care and when to call the doctor were all discussed with patient by RN and she verbalized understanding on the instructions given.

## 2021-09-17 NOTE — Plan of Care (Signed)
  Problem: Safety: Goal: Ability to remain free from injury will improve Outcome: Completed/Met   Problem: Education: Goal: Knowledge of the prescribed therapeutic regimen will improve Outcome: Completed/Met Goal: Understanding of discharge needs will improve Outcome: Completed/Met Goal: Individualized Educational Video(s) Outcome: Completed/Met   Problem: Activity: Goal: Ability to avoid complications of mobility impairment will improve Outcome: Completed/Met Goal: Ability to tolerate increased activity will improve Outcome: Completed/Met   Problem: Clinical Measurements: Goal: Postoperative complications will be avoided or minimized Outcome: Completed/Met   Problem: Pain Management: Goal: Pain level will decrease with appropriate interventions Outcome: Completed/Met   Problem: Skin Integrity: Goal: Will show signs of wound healing Outcome: Completed/Met

## 2021-09-18 DIAGNOSIS — Z7982 Long term (current) use of aspirin: Secondary | ICD-10-CM | POA: Diagnosis not present

## 2021-09-18 DIAGNOSIS — H269 Unspecified cataract: Secondary | ICD-10-CM | POA: Diagnosis not present

## 2021-09-18 DIAGNOSIS — Z471 Aftercare following joint replacement surgery: Secondary | ICD-10-CM | POA: Diagnosis not present

## 2021-09-18 DIAGNOSIS — K5900 Constipation, unspecified: Secondary | ICD-10-CM | POA: Diagnosis not present

## 2021-09-18 DIAGNOSIS — Z96642 Presence of left artificial hip joint: Secondary | ICD-10-CM | POA: Diagnosis not present

## 2021-09-18 DIAGNOSIS — F32A Depression, unspecified: Secondary | ICD-10-CM | POA: Diagnosis not present

## 2021-09-18 DIAGNOSIS — F419 Anxiety disorder, unspecified: Secondary | ICD-10-CM | POA: Diagnosis not present

## 2021-09-18 DIAGNOSIS — I10 Essential (primary) hypertension: Secondary | ICD-10-CM | POA: Diagnosis not present

## 2021-09-18 DIAGNOSIS — F909 Attention-deficit hyperactivity disorder, unspecified type: Secondary | ICD-10-CM | POA: Diagnosis not present

## 2021-09-21 DIAGNOSIS — K5900 Constipation, unspecified: Secondary | ICD-10-CM | POA: Diagnosis not present

## 2021-09-21 DIAGNOSIS — F32A Depression, unspecified: Secondary | ICD-10-CM | POA: Diagnosis not present

## 2021-09-21 DIAGNOSIS — H269 Unspecified cataract: Secondary | ICD-10-CM | POA: Diagnosis not present

## 2021-09-21 DIAGNOSIS — F909 Attention-deficit hyperactivity disorder, unspecified type: Secondary | ICD-10-CM | POA: Diagnosis not present

## 2021-09-21 DIAGNOSIS — F419 Anxiety disorder, unspecified: Secondary | ICD-10-CM | POA: Diagnosis not present

## 2021-09-21 DIAGNOSIS — Z7982 Long term (current) use of aspirin: Secondary | ICD-10-CM | POA: Diagnosis not present

## 2021-09-21 DIAGNOSIS — I10 Essential (primary) hypertension: Secondary | ICD-10-CM | POA: Diagnosis not present

## 2021-09-21 DIAGNOSIS — Z96642 Presence of left artificial hip joint: Secondary | ICD-10-CM | POA: Diagnosis not present

## 2021-09-21 DIAGNOSIS — Z471 Aftercare following joint replacement surgery: Secondary | ICD-10-CM | POA: Diagnosis not present

## 2021-09-22 DIAGNOSIS — Z471 Aftercare following joint replacement surgery: Secondary | ICD-10-CM | POA: Diagnosis not present

## 2021-09-22 DIAGNOSIS — F419 Anxiety disorder, unspecified: Secondary | ICD-10-CM | POA: Diagnosis not present

## 2021-09-22 DIAGNOSIS — H269 Unspecified cataract: Secondary | ICD-10-CM | POA: Diagnosis not present

## 2021-09-22 DIAGNOSIS — F909 Attention-deficit hyperactivity disorder, unspecified type: Secondary | ICD-10-CM | POA: Diagnosis not present

## 2021-09-22 DIAGNOSIS — I10 Essential (primary) hypertension: Secondary | ICD-10-CM | POA: Diagnosis not present

## 2021-09-22 DIAGNOSIS — Z7982 Long term (current) use of aspirin: Secondary | ICD-10-CM | POA: Diagnosis not present

## 2021-09-22 DIAGNOSIS — F32A Depression, unspecified: Secondary | ICD-10-CM | POA: Diagnosis not present

## 2021-09-22 DIAGNOSIS — Z96642 Presence of left artificial hip joint: Secondary | ICD-10-CM | POA: Diagnosis not present

## 2021-09-22 DIAGNOSIS — K5900 Constipation, unspecified: Secondary | ICD-10-CM | POA: Diagnosis not present

## 2021-09-23 ENCOUNTER — Telehealth: Payer: Self-pay

## 2021-09-23 ENCOUNTER — Telehealth: Payer: Self-pay | Admitting: Orthopaedic Surgery

## 2021-09-23 ENCOUNTER — Telehealth: Payer: Self-pay | Admitting: *Deleted

## 2021-09-23 ENCOUNTER — Other Ambulatory Visit: Payer: Self-pay | Admitting: Physician Assistant

## 2021-09-23 DIAGNOSIS — F419 Anxiety disorder, unspecified: Secondary | ICD-10-CM | POA: Diagnosis not present

## 2021-09-23 DIAGNOSIS — H269 Unspecified cataract: Secondary | ICD-10-CM | POA: Diagnosis not present

## 2021-09-23 DIAGNOSIS — Z471 Aftercare following joint replacement surgery: Secondary | ICD-10-CM | POA: Diagnosis not present

## 2021-09-23 DIAGNOSIS — K5900 Constipation, unspecified: Secondary | ICD-10-CM | POA: Diagnosis not present

## 2021-09-23 DIAGNOSIS — F909 Attention-deficit hyperactivity disorder, unspecified type: Secondary | ICD-10-CM | POA: Diagnosis not present

## 2021-09-23 DIAGNOSIS — Z7982 Long term (current) use of aspirin: Secondary | ICD-10-CM | POA: Diagnosis not present

## 2021-09-23 DIAGNOSIS — Z96642 Presence of left artificial hip joint: Secondary | ICD-10-CM | POA: Diagnosis not present

## 2021-09-23 DIAGNOSIS — F32A Depression, unspecified: Secondary | ICD-10-CM | POA: Diagnosis not present

## 2021-09-23 DIAGNOSIS — I10 Essential (primary) hypertension: Secondary | ICD-10-CM | POA: Diagnosis not present

## 2021-09-23 MED ORDER — OXYCODONE-ACETAMINOPHEN 5-325 MG PO TABS: 1.0000 | ORAL_TABLET | Freq: Four times a day (QID) | ORAL | 0 refills | Status: DC | PRN

## 2021-09-23 NOTE — Telephone Encounter (Signed)
Pt called stating she needs a refill of her percocet 5-325 mg rx and she would like a CB when this has been sent in please.   [phone]

## 2021-09-23 NOTE — Telephone Encounter (Signed)
Sent in.  Can we delete other pharmacies?

## 2021-09-23 NOTE — Telephone Encounter (Signed)
Spoke with patient and reviewed post op instructions and troubleshooting. She sounds like she is very constipated. Recommended OTC mag citrate 1/2 bottle today and other 1/2 tomorrow to help her have a BM. She is concerned about fluid around incision. Warm to touch, painful, tight/taut. Discussed most likely a seroma, but made appt for her for tomorrow, 09/24/21 at 3:45 pm. She is aware.

## 2021-09-23 NOTE — Telephone Encounter (Signed)
See email from [place].  "Hi Marisue Ivan-   We saw a patient over the weekend for Dr Roda Shutters who reported that she was experiencing vomiting and has not had a bowel movement in several days. I spoke to Dr Roda Shutters about this patient on Friday  but the patient reported the vomiting and bowel problems after I spoke to him. Can you please inform Dr Roda Shutters ?    Patient is Sheila Salazar, DOB 2066-09-09     Thank you     Dorene Sorrow "

## 2021-09-23 NOTE — Telephone Encounter (Signed)
Sent in

## 2021-09-23 NOTE — Telephone Encounter (Signed)
RNCM attempted call to patient to review some post op instructions and ask about N/V/constipation reported by HHPT. No answer and left VM requesting call back.

## 2021-09-23 NOTE — Telephone Encounter (Signed)
Pt called requesting a call back concerning her prescription. Pt states her oxycodone has been sent to wrong pharmacy and asked for all pharmacies to be deleted except [facility]. Please resend oxycodone to [facility]. Pt phone number is [phone].

## 2021-09-24 ENCOUNTER — Ambulatory Visit (INDEPENDENT_AMBULATORY_CARE_PROVIDER_SITE_OTHER): Payer: BC Managed Care – PPO | Admitting: Orthopaedic Surgery

## 2021-09-24 ENCOUNTER — Encounter: Payer: Self-pay | Admitting: Orthopaedic Surgery

## 2021-09-24 ENCOUNTER — Other Ambulatory Visit: Payer: Self-pay

## 2021-09-24 DIAGNOSIS — Z96642 Presence of left artificial hip joint: Secondary | ICD-10-CM

## 2021-09-24 MED ORDER — TRAMADOL HCL 50 MG PO TABS: 50.0000 mg | ORAL_TABLET | Freq: Two times a day (BID) | ORAL | 0 refills | Status: DC | PRN

## 2021-09-24 MED ORDER — GABAPENTIN 100 MG PO CAPS: 100.0000 mg | ORAL_CAPSULE | Freq: Three times a day (TID) | ORAL | 3 refills | Status: DC

## 2021-09-24 MED ORDER — KETOROLAC TROMETHAMINE 10 MG PO TABS: 10.0000 mg | ORAL_TABLET | Freq: Two times a day (BID) | ORAL | 0 refills | Status: DC | PRN

## 2021-09-24 NOTE — Progress Notes (Signed)
Post-Op Visit Note   Patient: Sheila Salazar           Date of Birth: 1966/06/16           MRN: [id number] Visit Date: 09/24/2021 PCP: Patient, No Pcp Per (Inactive)   Assessment & Plan:  Chief Complaint:  Chief Complaint  Patient presents with   Left Hip - Pain, Routine Post Op   Visit Diagnoses:  1. Status post total replacement of left hip     Plan: [name] returns today for severe left hip pain and burning.  She is 8 days status post left total hip replacement on 09/16/2021.  She feels tightness and burning numbness around the thigh.  Denies any constitutional symptoms.  She is receiving home health PT currently.  Left hip surgical incision is intact without any evidence of infection.  She has moderate generalized swelling without discrete fluid pocket or seroma.  No evidence of infection.  She has trouble with hip flexion and activation of quadriceps secondary to pain and guarding.  Overall findings are consistent with postsurgical pain and inflammation.  I do not feel that there is discrete seroma to aspirate.  I recommended aggressive icing with the Iceman.  Of also recommended prescription for tramadol, gabapentin, Toradol to help with her symptoms.  She will back off on activity and follow-up with [place] in about a week as scheduled.  Follow-Up Instructions: No follow-ups on file.   Orders:  No orders of the defined types were placed in this encounter.  Meds ordered this encounter  Medications   ketorolac (TORADOL) 10 MG tablet    Sig: Take 1 tablet (10 mg total) by mouth 2 (two) times daily as needed.    Dispense:  10 tablet    Refill:  0   traMADol (ULTRAM) 50 MG tablet    Sig: Take 1-2 tablets (50-100 mg total) by mouth every 12 (twelve) hours as needed.    Dispense:  30 tablet    Refill:  0   gabapentin (NEURONTIN) 100 MG capsule    Sig: Take 1-3 capsules (100-300 mg total) by mouth 3 (three) times daily.    Dispense:  30 capsule    Refill:  3    Imaging: No  results found.  PMFS History: Patient Active Problem List   Diagnosis Date Noted   Primary osteoarthritis of left hip 09/16/2021   Status post total replacement of left hip 09/16/2021   Nonallopathic lesion of sacral region 07/27/2018   Nonallopathic lesion of rib cage 07/27/2018   Patellofemoral syndrome of both knees 02/10/2017   Right lumbar radiculopathy 03/06/2015   SI (sacroiliac) joint dysfunction 08/25/2014   Abdominal pain, chronic, right lower quadrant 04/04/2014   Hip flexor tightness 02/22/2014   Neck muscle spasm 01/31/2014   Nonallopathic lesion of cervical region 01/31/2014   Nonallopathic lesion of thoracic region 01/31/2014   Nonallopathic lesion of lumbosacral region 01/31/2014   Past Medical History:  Diagnosis Date   ADHD (attention deficit hyperactivity disorder)    Allergy    Anxiety    Depression    Headache    Hypertension     Family History  Problem Relation Age of Onset   Breast cancer Mother     Past Surgical History:  Procedure Laterality Date   EYE SURGERY     TOTAL HIP ARTHROPLASTY Left 09/16/2021   Procedure: LEFT TOTAL HIP ARTHROPLASTY ANTERIOR APPROACH;  Surgeon: Tarry Kos, MD;  Location: [facility] OR;  Service: Orthopedics;  Laterality: Left;  Social History   Occupational History   Not on file  Tobacco Use   Smoking status: Never   Smokeless tobacco: Never  Vaping Use   Vaping Use: Never used  Substance and Sexual Activity   Alcohol use: Yes    Comment: occasionally   Drug use: Not on file   Sexual activity: Not on file

## 2021-09-25 DIAGNOSIS — I10 Essential (primary) hypertension: Secondary | ICD-10-CM | POA: Diagnosis not present

## 2021-09-25 DIAGNOSIS — Z471 Aftercare following joint replacement surgery: Secondary | ICD-10-CM | POA: Diagnosis not present

## 2021-09-25 DIAGNOSIS — Z96642 Presence of left artificial hip joint: Secondary | ICD-10-CM | POA: Diagnosis not present

## 2021-09-25 DIAGNOSIS — K5900 Constipation, unspecified: Secondary | ICD-10-CM | POA: Diagnosis not present

## 2021-09-25 DIAGNOSIS — F32A Depression, unspecified: Secondary | ICD-10-CM | POA: Diagnosis not present

## 2021-09-25 DIAGNOSIS — F909 Attention-deficit hyperactivity disorder, unspecified type: Secondary | ICD-10-CM | POA: Diagnosis not present

## 2021-09-25 DIAGNOSIS — H269 Unspecified cataract: Secondary | ICD-10-CM | POA: Diagnosis not present

## 2021-09-25 DIAGNOSIS — F419 Anxiety disorder, unspecified: Secondary | ICD-10-CM | POA: Diagnosis not present

## 2021-09-25 DIAGNOSIS — Z7982 Long term (current) use of aspirin: Secondary | ICD-10-CM | POA: Diagnosis not present

## 2021-09-26 ENCOUNTER — Telehealth: Payer: Self-pay

## 2021-09-26 NOTE — Telephone Encounter (Signed)
Dorene Sorrow called and states patient is scared to take all 3 meds. She googled the Side effects  for tramadol, gabapentin, Toradol. Would like Dr Roda Shutters to call her to discuss more in detail.   CB [phone]

## 2021-09-28 DIAGNOSIS — F419 Anxiety disorder, unspecified: Secondary | ICD-10-CM | POA: Diagnosis not present

## 2021-09-28 DIAGNOSIS — K5900 Constipation, unspecified: Secondary | ICD-10-CM | POA: Diagnosis not present

## 2021-09-28 DIAGNOSIS — H269 Unspecified cataract: Secondary | ICD-10-CM | POA: Diagnosis not present

## 2021-09-28 DIAGNOSIS — F909 Attention-deficit hyperactivity disorder, unspecified type: Secondary | ICD-10-CM | POA: Diagnosis not present

## 2021-09-28 DIAGNOSIS — F32A Depression, unspecified: Secondary | ICD-10-CM | POA: Diagnosis not present

## 2021-09-28 DIAGNOSIS — Z7982 Long term (current) use of aspirin: Secondary | ICD-10-CM | POA: Diagnosis not present

## 2021-09-28 DIAGNOSIS — Z96642 Presence of left artificial hip joint: Secondary | ICD-10-CM | POA: Diagnosis not present

## 2021-09-28 DIAGNOSIS — Z471 Aftercare following joint replacement surgery: Secondary | ICD-10-CM | POA: Diagnosis not present

## 2021-09-28 DIAGNOSIS — I10 Essential (primary) hypertension: Secondary | ICD-10-CM | POA: Diagnosis not present

## 2021-09-30 DIAGNOSIS — I10 Essential (primary) hypertension: Secondary | ICD-10-CM | POA: Diagnosis not present

## 2021-09-30 DIAGNOSIS — F419 Anxiety disorder, unspecified: Secondary | ICD-10-CM | POA: Diagnosis not present

## 2021-09-30 DIAGNOSIS — H269 Unspecified cataract: Secondary | ICD-10-CM | POA: Diagnosis not present

## 2021-09-30 DIAGNOSIS — Z7982 Long term (current) use of aspirin: Secondary | ICD-10-CM | POA: Diagnosis not present

## 2021-09-30 DIAGNOSIS — Z96642 Presence of left artificial hip joint: Secondary | ICD-10-CM | POA: Diagnosis not present

## 2021-09-30 DIAGNOSIS — K5900 Constipation, unspecified: Secondary | ICD-10-CM | POA: Diagnosis not present

## 2021-09-30 DIAGNOSIS — F32A Depression, unspecified: Secondary | ICD-10-CM | POA: Diagnosis not present

## 2021-09-30 DIAGNOSIS — F909 Attention-deficit hyperactivity disorder, unspecified type: Secondary | ICD-10-CM | POA: Diagnosis not present

## 2021-09-30 DIAGNOSIS — Z471 Aftercare following joint replacement surgery: Secondary | ICD-10-CM | POA: Diagnosis not present

## 2021-10-01 ENCOUNTER — Ambulatory Visit (INDEPENDENT_AMBULATORY_CARE_PROVIDER_SITE_OTHER): Payer: BC Managed Care – PPO | Admitting: Orthopaedic Surgery

## 2021-10-01 ENCOUNTER — Encounter: Payer: Self-pay | Admitting: Orthopaedic Surgery

## 2021-10-01 ENCOUNTER — Other Ambulatory Visit: Payer: Self-pay

## 2021-10-01 DIAGNOSIS — Z96642 Presence of left artificial hip joint: Secondary | ICD-10-CM

## 2021-10-01 MED ORDER — CEPHALEXIN 500 MG PO CAPS: 500.0000 mg | ORAL_CAPSULE | Freq: Four times a day (QID) | ORAL | 0 refills | Status: DC

## 2021-10-01 NOTE — Progress Notes (Signed)
z  Post-Op Visit Note   Patient: Sheila Salazar           Date of Birth: Mar 16, 1966           MRN: [id number] Visit Date: 10/01/2021 PCP: Patient, No Pcp Per (Inactive)   Assessment & Plan:  Chief Complaint:  Chief Complaint  Patient presents with   Left Hip - Routine Post Op, Pain   Visit Diagnoses:  1. Status post total replacement of left hip     Plan: [name] returns today 2-week status post left total hip replacement.  She is doing much better and has been discharged from home health PT to self directed exercises.  She is ready to drive.  She has no real complaints other than the swelling and the seroma.  Left hip surgical incision has healed without any evidence of infection.  There is a small fluctuant area consistent with a postsurgical seroma.  There is no evidence of infection.  [name] has done well from her surgery.  I aspirated the seroma today and obtained 80 cc and placed her on 10 days of Keflex.  She will continue with her home exercises and increase activity as tolerated.  Implant card provided.  She would like to try to drive at this point and I think it is fine since she is doing very well.  We will see her back in 4 weeks with AP pelvis and lateral hip.  Follow-Up Instructions: Return in about 4 weeks (around 10/29/2021).   Orders:  No orders of the defined types were placed in this encounter.  Meds ordered this encounter  Medications   cephALEXin (KEFLEX) 500 MG capsule    Sig: Take 1 capsule (500 mg total) by mouth 4 (four) times daily.    Dispense:  40 capsule    Refill:  0    Imaging: No results found.  PMFS History: Patient Active Problem List   Diagnosis Date Noted   Primary osteoarthritis of left hip 09/16/2021   Status post total replacement of left hip 09/16/2021   Nonallopathic lesion of sacral region 07/27/2018   Nonallopathic lesion of rib cage 07/27/2018   Patellofemoral syndrome of both knees 02/10/2017   Right lumbar radiculopathy  03/06/2015   SI (sacroiliac) joint dysfunction 08/25/2014   Abdominal pain, chronic, right lower quadrant 04/04/2014   Hip flexor tightness 02/22/2014   Neck muscle spasm 01/31/2014   Nonallopathic lesion of cervical region 01/31/2014   Nonallopathic lesion of thoracic region 01/31/2014   Nonallopathic lesion of lumbosacral region 01/31/2014   Past Medical History:  Diagnosis Date   ADHD (attention deficit hyperactivity disorder)    Allergy    Anxiety    Depression    Headache    Hypertension     Family History  Problem Relation Age of Onset   Breast cancer Mother     Past Surgical History:  Procedure Laterality Date   EYE SURGERY     TOTAL HIP ARTHROPLASTY Left 09/16/2021   Procedure: LEFT TOTAL HIP ARTHROPLASTY ANTERIOR APPROACH;  Surgeon: Tarry Kos, MD;  Location: [facility] OR;  Service: Orthopedics;  Laterality: Left;   Social History   Occupational History   Not on file  Tobacco Use   Smoking status: Never   Smokeless tobacco: Never  Vaping Use   Vaping Use: Never used  Substance and Sexual Activity   Alcohol use: Yes    Comment: occasionally   Drug use: Not on file   Sexual activity: Not on file

## 2021-10-08 ENCOUNTER — Telehealth: Payer: Self-pay | Admitting: Orthopaedic Surgery

## 2021-10-08 NOTE — Telephone Encounter (Signed)
Pt called requesting a call back from [place]. Pt did not disclose reasoning for her call. Please call pt at [phone].

## 2021-10-09 ENCOUNTER — Other Ambulatory Visit: Payer: Self-pay

## 2021-10-09 DIAGNOSIS — Z96642 Presence of left artificial hip joint: Secondary | ICD-10-CM

## 2021-10-09 NOTE — Telephone Encounter (Signed)
Called patient. She wanted to know if we can put PT order. Order made. Also wanted to know if she can have her teeth cleaned tomorrow. Per lindsey, she needs to R/S 2 months after SU and will need ABX. She will call us when she needs ABX.

## 2021-10-10 ENCOUNTER — Other Ambulatory Visit: Payer: Self-pay

## 2021-10-10 ENCOUNTER — Encounter: Payer: Self-pay | Admitting: Rehabilitative and Restorative Service Providers"

## 2021-10-10 ENCOUNTER — Ambulatory Visit (INDEPENDENT_AMBULATORY_CARE_PROVIDER_SITE_OTHER): Payer: BC Managed Care – PPO | Admitting: Rehabilitative and Restorative Service Providers"

## 2021-10-10 DIAGNOSIS — F988 Other specified behavioral and emotional disorders with onset usually occurring in childhood and adolescence: Secondary | ICD-10-CM | POA: Diagnosis not present

## 2021-10-10 DIAGNOSIS — R262 Difficulty in walking, not elsewhere classified: Secondary | ICD-10-CM

## 2021-10-10 DIAGNOSIS — M25552 Pain in left hip: Secondary | ICD-10-CM | POA: Diagnosis not present

## 2021-10-10 DIAGNOSIS — R600 Localized edema: Secondary | ICD-10-CM

## 2021-10-10 DIAGNOSIS — F419 Anxiety disorder, unspecified: Secondary | ICD-10-CM | POA: Diagnosis not present

## 2021-10-10 DIAGNOSIS — M25652 Stiffness of left hip, not elsewhere classified: Secondary | ICD-10-CM | POA: Diagnosis not present

## 2021-10-10 DIAGNOSIS — F339 Major depressive disorder, recurrent, unspecified: Secondary | ICD-10-CM | POA: Diagnosis not present

## 2021-10-10 DIAGNOSIS — M6281 Muscle weakness (generalized): Secondary | ICD-10-CM

## 2021-10-10 DIAGNOSIS — G4700 Insomnia, unspecified: Secondary | ICD-10-CM | POA: Diagnosis not present

## 2021-10-10 NOTE — Therapy (Signed)
[facility] [address] [place], [place], [address] Phone: [phone]   Fax:  [phone]  Physical Therapy Evaluation  Patient Details  Name: Sheila Salazar MRN: [id number] Date of Birth: [birth year] Referring Provider (PT): Tarry Kos MD   Encounter Date: 10/10/2021   PT End of Session - 10/10/21 0946     Visit Number 1    Number of Visits 16    Date for PT Re-Evaluation 12/05/21    PT Start Time 0850    PT Stop Time 0932    PT Time Calculation (min) 42 min    Activity Tolerance Patient tolerated treatment well;No increased pain    Behavior During Therapy WFL for tasks assessed/performed             Past Medical History:  Diagnosis Date   ADHD (attention deficit hyperactivity disorder)    Allergy    Anxiety    Depression    Headache    Hypertension     Past Surgical History:  Procedure Laterality Date   EYE SURGERY     TOTAL HIP ARTHROPLASTY Left 09/16/2021   Procedure: LEFT TOTAL HIP ARTHROPLASTY ANTERIOR APPROACH;  Surgeon: Tarry Kos, MD;  Location: [facility] OR;  Service: Orthopedics;  Laterality: Left;    There were no vitals filed for this visit.    Subjective Assessment - 10/10/21 0942     Subjective Sheila Salazar had her L hip replaced 09/16/2021.  She feels as if things are better post-surgery, but she is having more pain now than she did 2 weeks ago.  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    Pertinent History Previous R lumbar radiculopathy and B knee pain    Limitations Walking;Lifting;House hold activities;Standing    How long can you sit comfortably? Start-up stiffness    How long can you stand comfortably? 30 minutes    How long can you walk comfortably? 10-15 minutes    Patient Stated Goals Return to work and be able to do stairs without a handrail.  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    Currently in Pain? Yes    Pain Score 6     Pain Location Hip     Pain Orientation Left    Pain Descriptors / Indicators Sore;Tightness    Pain Type Surgical pain    Pain Radiating Towards NA    Pain Onset 1 to 4 weeks ago    Pain Frequency Intermittent    Aggravating Factors  End range AROM, prolonged postures and activities    Pain Relieving Factors Pain med shelp along with change of position    Effect of Pain on Daily Activities Out of work and not working out    Multiple Pain Sites No                [facility] PT Assessment - 10/10/21 0001       Assessment   Medical Diagnosis s/p L THA anterior    Referring Provider (PT) Coralyn Mark Donnelly Stager MD    Onset Date/Surgical Date 09/16/21    Next MD Visit 10/29/2021      Precautions   Precautions Anterior Hip      Restrictions   Weight Bearing Restrictions No    Other Position/Activity Restrictions Avoid excessive extension      Balance Screen   Has the patient fallen in the past 6 months No    Has the  patient had a decrease in activity level because of a fear of falling?  No    Is the patient reluctant to leave their home because of a fear of falling?  No      Home Nurse, mental health Private residence    Living Arrangements Alone    Available Help at Discharge Friend(s)    Additional Comments Stairs      Prior Function   Level of Independence Independent    Vocation Full time employment    Vocation Requirements Has to be up and down stairs    Leisure Workout, walking dogs      Cognition   Overall Cognitive Status Within Functional Limits for tasks assessed      Observation/Other Assessments   Focus on Therapeutic Outcomes (FOTO)  33 (Goal 63 by visit 17)      ROM / Strength   AROM / PROM / Strength AROM;Strength      AROM   Overall AROM  Deficits    AROM Assessment Site Hip    Right/Left Hip Left;Right    Right Hip Flexion 115    Right Hip External Rotation  18    Right Hip Internal Rotation  15    Left Hip Flexion 90    Left Hip External Rotation  14    Left  Hip Internal Rotation  6      Strength   Overall Strength Deficits    Strength Assessment Site Hip    Right/Left Hip Left;Right    Right Hip Flexion 4/5    Right Hip ABduction 4+/5    Left Hip Flexion 3-/5    Left Hip ABduction 3+/5      Flexibility   Soft Tissue Assessment /Muscle Length yes    Hamstrings 40/60 degrees L/R                        Objective measurements completed on examination: See above findings.       [facility] Adult PT Treatment/Exercise - 10/10/21 0001       Therapeutic Activites    Therapeutic Activities ADL's    ADL's Up and down 6 inch step with single leg balance and slow eccentrics      Neuro Re-ed    Neuro Re-ed Details  Tandem balance eyes open 4X 20 seconds each      Exercises   Exercises Knee/Hip      Knee/Hip Exercises: Stretches   Hip Flexor Stretch 4 reps;20 seconds    Hip Flexor Stretch Limitations Other leg straight    Piriformis Stretch Both;4 reps;20 seconds    Piriformis Stretch Limitations Figure 4 and push knee away from body      Knee/Hip Exercises: Standing   Other Standing Knee Exercises Alternating hip hike 10X 3 seconds                     PT Education - 10/10/21 0946     Education Details Reviewed post-surgical imaging, results of today's exam and starter HEP.    Person(s) Educated Patient    Methods Explanation;Demonstration;Tactile cues;Verbal cues;Handout    Comprehension Verbalized understanding;Tactile cues required;Need further instruction;Returned demonstration;Verbal cues required              PT Short Term Goals - 10/10/21 0951       PT SHORT TERM GOAL #1   Title Sheila Salazar will improve L hip AROM for flexion to 100 degrees, IR to 10 degrees  and ER to 30 degrees.    Baseline 90/6/14 degrees    Time 4    Period Weeks    Status New    Target Date 11/07/21      PT SHORT TERM GOAL #2   Title Sheila Salazar will report L hip pain consistently 0-4/10 on the Numeric Pain Rating Scale.     Baseline 6/10    Time 4    Period Weeks    Status New    Target Date 11/07/21      PT SHORT TERM GOAL #3   Title Sheila Salazar will return to full-time work.    Baseline Out of work    Time 4    Period Weeks    Status New    Target Date 11/07/21               PT Long Term Goals - 10/10/21 0953       PT LONG TERM GOAL #1   Title Improve FOTO score to 63.    Baseline 33    Time 8    Period Weeks    Status New    Target Date 12/05/21      PT LONG TERM GOAL #2   Title Sheila Salazar will report L hip pain consistently 0-2/10 on the Numeric Pain Rating Scale.    Baseline 6/10    Time 8    Period Weeks    Status New    Target Date 12/05/21      PT LONG TERM GOAL #3   Title Improve L hip strength for hip flexors and abductors to 5/5 MMT.    Baseline 3- to 3+/5 MMT    Time 8    Period Weeks    Status New    Target Date 12/05/21      PT LONG TERM GOAL #4   Title Sheila Salazar will be independent with her long-term HEP at [place].    Baseline Prescribed today.    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  She needs to be able to get up and down stairs.  She has more pain over the past 2 weeks vs the 2 weeks immediately after surgery and will benefit from skilled PT to address AROM, strength, balance and functional impairments noted today.    Examination-Activity Limitations Stairs;Stand;Dressing;Bend;Lift;Locomotion Level;Squat    Examination-Participation Restrictions Occupation;Community Activity    Stability/Clinical Decision Making Stable/Uncomplicated    Clinical Decision Making Low    Rehab Potential Good    PT Frequency 2x / week    PT Duration 8 weeks    PT Treatment/Interventions ADLs/Self Care Home Management;Cryotherapy;Moist Heat;Gait training;Stair training;Functional mobility training;Therapeutic  activities;Neuromuscular re-education;Balance training;Therapeutic exercise;Patient/family education;Manual techniques    PT Next Visit Plan Review HEP.  Consider hamstrings stretch, progressing balance and hip strength and functional work on stairs.    PT Home Exercise Plan [email or web address]    Consulted and Agree with Plan of Care Patient             Patient will benefit from skilled therapeutic intervention in order to improve the following deficits and impairments:  Abnormal gait, Decreased balance, Decreased endurance, Decreased range of motion, Difficulty walking, Decreased strength, Increased  edema, Impaired flexibility, Pain  Visit Diagnosis: Difficulty walking  Muscle weakness (generalized)  Stiffness of left hip, not elsewhere classified  Pain in left hip  Localized edema     Problem List Patient Active Problem List   Diagnosis Date Noted   Primary osteoarthritis of left hip 09/16/2021   Status post total replacement of left hip 09/16/2021   Nonallopathic lesion of sacral region 07/27/2018   Nonallopathic lesion of rib cage 07/27/2018   Patellofemoral syndrome of both knees 02/10/2017   Right lumbar radiculopathy 03/06/2015   SI (sacroiliac) joint dysfunction 08/25/2014   Abdominal pain, chronic, right lower quadrant 04/04/2014   Hip flexor tightness 02/22/2014   Neck muscle spasm 01/31/2014   Nonallopathic lesion of cervical region 01/31/2014   Nonallopathic lesion of thoracic region 01/31/2014   Nonallopathic lesion of lumbosacral region 01/31/2014    Cherlyn Cushing, PT, MPT 10/10/2021, 9:57 AM  [facility] [address] [place], [place], [address] Phone: [phone]   Fax:  [phone]  Name: Sheila Salazar MRN: [id number] Date of Birth: 04-18-1966

## 2021-10-10 NOTE — Patient Instructions (Signed)
Access Code: [email or web address] URL: [email or web address] Date: 10/10/2021 Prepared by: Pauletta Browns  Exercises Single Knee to Chest Stretch - 2-3 x daily - 7 x weekly - 1 sets - 5 reps - 20 seconds hold Supine Figure 4 Piriformis Stretch - 2-3 x daily - 7 x weekly - 1 sets - 5 reps - 20 seconds hold Standing Hip Hiking - 2 x daily - 7 x weekly - 2 sets - 10 reps - 3 seconds hold Tandem Stance - 2-3 x daily - 7 x weekly - 1 sets - 4 reps - 20 second hold

## 2021-10-11 ENCOUNTER — Telehealth: Payer: Self-pay | Admitting: Rehabilitative and Restorative Service Providers"

## 2021-10-11 ENCOUNTER — Encounter: Payer: BC Managed Care – PPO | Admitting: Rehabilitative and Restorative Service Providers"

## 2021-10-11 NOTE — Telephone Encounter (Signed)
Called after 20 mins no show for appointment this morning.  Left message with next appointment time and return phone call number information.  1st no show of treatment cycle.  Chyrel Masson, PT, DPT, OCS, ATC 10/11/21  9:08 AM

## 2021-10-14 ENCOUNTER — Ambulatory Visit (INDEPENDENT_AMBULATORY_CARE_PROVIDER_SITE_OTHER): Payer: BC Managed Care – PPO | Admitting: Physical Therapy

## 2021-10-14 ENCOUNTER — Encounter: Payer: Self-pay | Admitting: Physical Therapy

## 2021-10-14 ENCOUNTER — Other Ambulatory Visit: Payer: Self-pay

## 2021-10-14 DIAGNOSIS — M6281 Muscle weakness (generalized): Secondary | ICD-10-CM

## 2021-10-14 DIAGNOSIS — M25552 Pain in left hip: Secondary | ICD-10-CM | POA: Diagnosis not present

## 2021-10-14 DIAGNOSIS — R262 Difficulty in walking, not elsewhere classified: Secondary | ICD-10-CM | POA: Diagnosis not present

## 2021-10-14 DIAGNOSIS — C44519 Basal cell carcinoma of skin of other part of trunk: Secondary | ICD-10-CM | POA: Diagnosis not present

## 2021-10-14 DIAGNOSIS — D045 Carcinoma in situ of skin of trunk: Secondary | ICD-10-CM | POA: Diagnosis not present

## 2021-10-14 DIAGNOSIS — M25652 Stiffness of left hip, not elsewhere classified: Secondary | ICD-10-CM

## 2021-10-14 DIAGNOSIS — Z85828 Personal history of other malignant neoplasm of skin: Secondary | ICD-10-CM | POA: Diagnosis not present

## 2021-10-14 DIAGNOSIS — L814 Other melanin hyperpigmentation: Secondary | ICD-10-CM | POA: Diagnosis not present

## 2021-10-14 DIAGNOSIS — R600 Localized edema: Secondary | ICD-10-CM

## 2021-10-14 NOTE — Therapy (Signed)
[facility] [address] [place], [place], [address] Phone: [phone]   Fax:  [phone]  Physical Therapy Treatment  Patient Details  Name: Sheila Salazar MRN: [id number] Date of Birth: July 08, 1966 Referring Provider (PT): Tarry Kos MD   Encounter Date: 10/14/2021   PT End of Session - 10/14/21 1154     Visit Number 2    Number of Visits 16    Date for PT Re-Evaluation 12/05/21    PT Start Time 1145    PT Stop Time 1231    PT Time Calculation (min) 46 min    Activity Tolerance Patient tolerated treatment well;No increased pain    Behavior During Therapy WFL for tasks assessed/performed             Past Medical History:  Diagnosis Date   ADHD (attention deficit hyperactivity disorder)    Allergy    Anxiety    Depression    Headache    Hypertension     Past Surgical History:  Procedure Laterality Date   EYE SURGERY     TOTAL HIP ARTHROPLASTY Left 09/16/2021   Procedure: LEFT TOTAL HIP ARTHROPLASTY ANTERIOR APPROACH;  Surgeon: Tarry Kos, MD;  Location: [facility] OR;  Service: Orthopedics;  Laterality: Left;    There were no vitals filed for this visit.   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    Pertinent History Previous R lumbar radiculopathy and B knee pain    Limitations Walking;Lifting;House hold activities;Standing    How long can you sit comfortably? Start-up stiffness    How long can you stand comfortably? 30 minutes    How long can you walk comfortably? 10-15 minutes    Patient Stated Goals Return to work and be able to do stairs without a handrail.  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    Currently in Pain? Yes    Pain Score 7    Since PT eval, lowest 1-2 /10 and highest 7/10   Pain Location Hip    Pain Orientation Left;Lateral;Anterior    Pain Descriptors / Indicators  Throbbing;Sharp    Pain Type Surgical pain    Pain Onset 1 to 4 weeks ago    Aggravating Factors  getting out of chair increases sharpness of pain    Pain Relieving Factors meds,                               [facility] Adult PT Treatment/Exercise - 10/14/21 1145       Bed Mobility   Bed Mobility Supine to Sit;Sit to Supine    Supine to Sit Independent    Supine to Sit Details (indicate cue type and reason) PT demo & verbal cues on technique to minimize back & hip strain. pt return demo understanding.    Sit to Supine Independent    Sit to Supine - Details (indicate cue type and reason) PT demo & verbal cues on technique to minimize back & hip strain. pt return demo understanding.      Ambulation/Gait   Stairs Yes    Stairs Assistance 5: Supervision    Stairs Assistance Details (indicate cue type and reason) PT demo & verbal cues on carryover of technique with step up & step down.    Stair Management Technique One rail  Right;One rail Left;Alternating pattern;Forwards    Number of Stairs 11   1set right rail & 2nd set left rail   Height of Stairs 7      Self-Care   Self-Care ADL's    ADL's PT demo & verbal cues on positioning with pillows including one under trunk for sidelying. She reports that she is a side sleeper.  Pt reported minimal to no hip discomfort with this positioning and verbalized understanding.      Therapeutic Activites    ADL's Pt has short legs consistent with her height.  PT demo & verbalized using Yoga block for positioning in sitting. Pt verbalized understanding & reported less hip / back pain.      Neuro Re-ed    Neuro Re-ed Details  SLS on LLE rolling tennis ball slow then fast ant/post, med/lat & circles to facilitate lt hip stabilization      Knee/Hip Exercises: Stretches   Active Hamstring Stretch Left;2 reps;20 seconds    Active Hamstring Stretch Limitations seated w/ LLE on mat table & strap for DF with trunk flexion    Hip Flexor  Stretch Left;2 reps;20 seconds    Hip Flexor Stretch Limitations supine RLE to chest to decrease lordosis & LLE over edge of table (needs strap to assist LLE in & out of position)    Piriformis Stretch Left;2 reps;20 seconds    Piriformis Stretch Limitations Figure 4 and push knee away from body    Other Knee/Hip Stretches Quadratus Lumborum stretch supine LEs crossed with lower trunk rotation.      Knee/Hip Exercises: Standing   Forward Step Up Both;1 set;10 reps;Hand Hold: 1;Step Height: 6"   light UE assist   Forward Step Up Limitations PT demo & verbal cues on technique progressed to carryover on stairs    Step Down Both;1 set;10 reps;Hand Hold: 2;Step Height: 6"   light UE support   Step Down Limitations PT demo & verbal cues on technique progressed to carryover on stairs    Other Standing Knee Exercises Alternating hip hike 10X 3 seconds            Access Code: [email or web address] URL: [email or web address] Date: 10/14/2021 Prepared by: Vladimir Faster  Exercises Supine Figure 4 Piriformis Stretch - 2-3 x daily - 7 x weekly - 1 sets - 5 reps - 20 seconds hold Standing Hip Hiking - 2 x daily - 7 x weekly - 2 sets - 10 reps - 3 seconds hold Tandem Stance - 2-3 x daily - 7 x weekly - 1 sets - 4 reps - 20 second hold Hip Flexor Stretch at Edge of Bed - 2-3 x daily - 7 x weekly - 1 sets - 2-4 reps - 20 seconds hold Supine Quadratus Lumborum Stretch - 2-3 x daily - 7 x weekly - 1 sets - 2-4 reps - 20 seconds hold Seated Table Hamstring Stretch - 2-3 x daily - 7 x weekly - 1 sets - 2-4 reps - 20 seconds hold Seated Combined Hamstring / Calf Stretch with cane - 2-3 x daily - 7 x weekly - 1 sets - 2-4 reps - 20 seconds hold Standing Rolling Forward & Back, Side to Side and Circles - 1 x daily - 7 x weekly - 2 sets - 10-15 reps          PT Education - 10/14/21 1253     Education Details updated Medbridge access code [email or web address]    Person(s) Educated Patient    Methods  Explanation;Demonstration;Tactile cues;Verbal  cues;Handout    Comprehension Verbalized understanding;Returned demonstration;Verbal cues required;Tactile cues required;Need further instruction              PT Short Term Goals - 10/10/21 0951       PT SHORT TERM GOAL #1   Title [name] will improve L hip AROM for flexion to 100 degrees, IR to 10 degrees and ER to 30 degrees.    Baseline 90/6/14 degrees    Time 4    Period Weeks    Status New    Target Date 11/07/21      PT SHORT TERM GOAL #2   Title [name] will report L hip pain consistently 0-4/10 on the Numeric Pain Rating Scale.    Baseline 6/10    Time 4    Period Weeks    Status New    Target Date 11/07/21      PT SHORT TERM GOAL #3   Title [name] will return to full-time work.    Baseline Out of work    Time 4    Period Weeks    Status New    Target Date 11/07/21               PT Long Term Goals - 10/10/21 0953       PT LONG TERM GOAL #1   Title Improve FOTO score to 63.    Baseline 33    Time 8    Period Weeks    Status New    Target Date 12/05/21      PT LONG TERM GOAL #2   Title [name] will report L hip pain consistently 0-2/10 on the Numeric Pain Rating Scale.    Baseline 6/10    Time 8    Period Weeks    Status New    Target Date 12/05/21      PT LONG TERM GOAL #3   Title Improve L hip strength for hip flexors and abductors to 5/5 MMT.    Baseline 3- to 3+/5 MMT    Time 8    Period Weeks    Status New    Target Date 12/05/21      PT LONG TERM GOAL #4   Title [name] will be independent with her long-term HEP at [place].    Baseline Prescribed today.    Time 8    Period Weeks    Status New    Target Date 12/05/21                   Plan - 10/14/21 1155     Clinical Impression Statement Patient's pain pattern appears to be possibly related to iliopsoas & Quadratus Lumborum tightness. PT instructed in new stretches & updated HEP. pt appears to understand.  Pt reports going back to  work tomorrow so PT addressed stair negotiation.  Pt was able to perform with minimal discomfort after instruction in technique.    Examination-Activity Limitations Stairs;Stand;Dressing;Bend;Lift;Locomotion Level;Squat    Examination-Participation Restrictions Occupation;Community Activity    Stability/Clinical Decision Making Stable/Uncomplicated    Rehab Potential Good    PT Frequency 2x / week    PT Duration 8 weeks    PT Treatment/Interventions ADLs/Self Care Home Management;Cryotherapy;Moist Heat;Gait training;Stair training;Functional mobility training;Therapeutic activities;Neuromuscular re-education;Balance training;Therapeutic exercise;Patient/family education;Manual techniques    PT Next Visit Plan continue to update HEP as indicate.  progressing balance and hip strength and functional work on stairs.    PT Home Exercise Plan [email or web address]    Consulted and Agree  with Plan of Care Patient             Patient will benefit from skilled therapeutic intervention in order to improve the following deficits and impairments:  Abnormal gait, Decreased balance, Decreased endurance, Decreased range of motion, Difficulty walking, Decreased strength, Increased edema, Impaired flexibility, Pain  Visit Diagnosis: Difficulty walking  Muscle weakness (generalized)  Stiffness of left hip, not elsewhere classified  Pain in left hip  Localized edema     Problem List Patient Active Problem List   Diagnosis Date Noted   Primary osteoarthritis of left hip 09/16/2021   Status post total replacement of left hip 09/16/2021   Nonallopathic lesion of sacral region 07/27/2018   Nonallopathic lesion of rib cage 07/27/2018   Patellofemoral syndrome of both knees 02/10/2017   Right lumbar radiculopathy 03/06/2015   SI (sacroiliac) joint dysfunction 08/25/2014   Abdominal pain, chronic, right lower quadrant 04/04/2014   Hip flexor tightness 02/22/2014   Neck muscle spasm 01/31/2014   Nonallopathic  lesion of cervical region 01/31/2014   Nonallopathic lesion of thoracic region 01/31/2014   Nonallopathic lesion of lumbosacral region 01/31/2014    Vladimir Faster, PT, DPT 10/14/2021, 12:54 PM  [facility] [address] [place], [place], [address] Phone: [phone]   Fax:  [phone]  Name: Dyonna Jaspers MRN: [id number] Date of Birth: 1966/09/02

## 2021-10-14 NOTE — Patient Instructions (Signed)
Access Code: [email or web address] URL: [email or web address] Date: 10/14/2021 Prepared by: Vladimir Faster  Exercises Supine Figure 4 Piriformis Stretch - 2-3 x daily - 7 x weekly - 1 sets - 5 reps - 20 seconds hold Standing Hip Hiking - 2 x daily - 7 x weekly - 2 sets - 10 reps - 3 seconds hold Tandem Stance - 2-3 x daily - 7 x weekly - 1 sets - 4 reps - 20 second hold Hip Flexor Stretch at Edge of Bed - 2-3 x daily - 7 x weekly - 1 sets - 2-4 reps - 20 seconds hold Supine Quadratus Lumborum Stretch - 2-3 x daily - 7 x weekly - 1 sets - 2-4 reps - 20 seconds hold Seated Table Hamstring Stretch - 2-3 x daily - 7 x weekly - 1 sets - 2-4 reps - 20 seconds hold Seated Combined Hamstring / Calf Stretch with cane - 2-3 x daily - 7 x weekly - 1 sets - 2-4 reps - 20 seconds hold Standing Rolling Forward & Back, Side to Side and Circles - 1 x daily - 7 x weekly - 2 sets - 10-15 reps

## 2021-10-17 ENCOUNTER — Telehealth: Payer: Self-pay | Admitting: Orthopaedic Surgery

## 2021-10-17 NOTE — Telephone Encounter (Signed)
Patient called. She has fluid in her knee. Made appointment for 11/1. Wanted sooner.

## 2021-10-17 NOTE — Telephone Encounter (Signed)
Sure have her come in Friday morning.  Thanks.

## 2021-10-18 ENCOUNTER — Telehealth: Payer: Self-pay | Admitting: Rehabilitative and Restorative Service Providers"

## 2021-10-18 ENCOUNTER — Encounter: Payer: BC Managed Care – PPO | Admitting: Rehabilitative and Restorative Service Providers"

## 2021-10-18 NOTE — Telephone Encounter (Signed)
Sheila Salazar is sick.  She called before 8 but was unable to leave a VM.  Reminded her of her appointment 11/2 Wednesday at 8:45.

## 2021-10-18 NOTE — Telephone Encounter (Signed)
Called patient so we can get her in this AM no answer. Will have to come as scheduled.

## 2021-10-22 ENCOUNTER — Ambulatory Visit (INDEPENDENT_AMBULATORY_CARE_PROVIDER_SITE_OTHER): Payer: BC Managed Care – PPO | Admitting: Orthopaedic Surgery

## 2021-10-22 ENCOUNTER — Ambulatory Visit: Payer: Self-pay

## 2021-10-22 ENCOUNTER — Other Ambulatory Visit: Payer: Self-pay

## 2021-10-22 ENCOUNTER — Encounter: Payer: Self-pay | Admitting: Orthopaedic Surgery

## 2021-10-22 DIAGNOSIS — Z96642 Presence of left artificial hip joint: Secondary | ICD-10-CM

## 2021-10-22 MED ORDER — CEPHALEXIN 500 MG PO CAPS: 500.0000 mg | ORAL_CAPSULE | Freq: Four times a day (QID) | ORAL | 0 refills | Status: DC

## 2021-10-22 MED ORDER — NAPROXEN 500 MG PO TABS: 500.0000 mg | ORAL_TABLET | Freq: Two times a day (BID) | ORAL | 3 refills | Status: DC

## 2021-10-22 MED ORDER — TRAMADOL HCL 50 MG PO TABS: 50.0000 mg | ORAL_TABLET | Freq: Every day | ORAL | 0 refills | Status: DC | PRN

## 2021-10-22 NOTE — Progress Notes (Signed)
Post-Op Visit Note   Patient: Sheila Salazar           Date of Birth: 12-26-1965           MRN: [id number] Visit Date: 10/22/2021 PCP: Patient, No Pcp Per (Inactive)   Assessment & Plan:  Chief Complaint:  Chief Complaint  Patient presents with   Left Hip - Pain, Follow-up    Left total hip arthroplasty 09/16/2021   Visit Diagnoses:  1. Status post total replacement of left hip     Plan: [name] is following up today for left hip swelling and inflammation.  She is feeling nerve pain down the thigh.  She has been doing physical therapy at our office twice a week.  She feels that she is not as far along as she would like to be.  She returned back to work a week ago.  Feels pain and stiffness with stretching exercises.  Left hip surgical scars well-healed.  She has some generalized swelling around the thigh.  No neurovascular compromise.  I attempted to aspirate seroma but was unable to obtain more than a few cc of fluid.  Overall I think that she might be overdoing it with activity.  I recommend that she stop PT for 2 weeks and I am going to put her on scheduled naproxen and tramadol for nighttime pain.  She will ice this aggressively as well so that she can feel as well as possible before she leaves for her [place] trip in 3 weeks.  Follow-Up Instructions: Return in about 6 weeks (around 12/03/2021).   Orders:  Orders Placed This Encounter  Procedures   XR HIP UNILAT W OR W/O PELVIS 2-3 VIEWS LEFT   Meds ordered this encounter  Medications   traMADol (ULTRAM) 50 MG tablet    Sig: Take 1-2 tablets (50-100 mg total) by mouth daily as needed.    Dispense:  20 tablet    Refill:  0   naproxen (NAPROSYN) 500 MG tablet    Sig: Take 1 tablet (500 mg total) by mouth 2 (two) times daily with a meal.    Dispense:  30 tablet    Refill:  3   cephALEXin (KEFLEX) 500 MG capsule    Sig: Take 1 capsule (500 mg total) by mouth 4 (four) times daily.    Dispense:  40 capsule    Refill:  0     Imaging: XR HIP UNILAT W OR W/O PELVIS 2-3 VIEWS LEFT  Result Date: 10/22/2021 Stable total hip replacement without complication   PMFS History: Patient Active Problem List   Diagnosis Date Noted   Primary osteoarthritis of left hip 09/16/2021   Status post total replacement of left hip 09/16/2021   Nonallopathic lesion of sacral region 07/27/2018   Nonallopathic lesion of rib cage 07/27/2018   Patellofemoral syndrome of both knees 02/10/2017   Right lumbar radiculopathy 03/06/2015   SI (sacroiliac) joint dysfunction 08/25/2014   Abdominal pain, chronic, right lower quadrant 04/04/2014   Hip flexor tightness 02/22/2014   Neck muscle spasm 01/31/2014   Nonallopathic lesion of cervical region 01/31/2014   Nonallopathic lesion of thoracic region 01/31/2014   Nonallopathic lesion of lumbosacral region 01/31/2014   Past Medical History:  Diagnosis Date   ADHD (attention deficit hyperactivity disorder)    Allergy    Anxiety    Depression    Headache    Hypertension     Family History  Problem Relation Age of Onset   Breast cancer Mother  Past Surgical History:  Procedure Laterality Date   EYE SURGERY     TOTAL HIP ARTHROPLASTY Left 09/16/2021   Procedure: LEFT TOTAL HIP ARTHROPLASTY ANTERIOR APPROACH;  Surgeon: Tarry Kos, MD;  Location: [facility];  Service: Orthopedics;  Laterality: Left;   Social History   Occupational History   Not on file  Tobacco Use   Smoking status: Never   Smokeless tobacco: Never  Vaping Use   Vaping Use: Never used  Substance and Sexual Activity   Alcohol use: Yes    Comment: occasionally   Drug use: Not on file   Sexual activity: Not on file

## 2021-10-23 ENCOUNTER — Encounter: Payer: Self-pay | Admitting: Physical Therapy

## 2021-10-23 ENCOUNTER — Ambulatory Visit (INDEPENDENT_AMBULATORY_CARE_PROVIDER_SITE_OTHER): Payer: BC Managed Care – PPO | Admitting: Physical Therapy

## 2021-10-23 ENCOUNTER — Telehealth: Payer: Self-pay | Admitting: Orthopaedic Surgery

## 2021-10-23 DIAGNOSIS — M25652 Stiffness of left hip, not elsewhere classified: Secondary | ICD-10-CM

## 2021-10-23 DIAGNOSIS — M25552 Pain in left hip: Secondary | ICD-10-CM | POA: Diagnosis not present

## 2021-10-23 DIAGNOSIS — R262 Difficulty in walking, not elsewhere classified: Secondary | ICD-10-CM

## 2021-10-23 DIAGNOSIS — M6281 Muscle weakness (generalized): Secondary | ICD-10-CM | POA: Diagnosis not present

## 2021-10-23 DIAGNOSIS — R600 Localized edema: Secondary | ICD-10-CM

## 2021-10-23 NOTE — Patient Instructions (Signed)
4 components of well rounded fitness program includes  Endurance - muscle & cardiovascular - discussed seated recumbent vs standing machines Flexibility - stretches &/or Yoga Strength - machines should balance muscles on both sides of joint, form is more important than resistance, Yoga can be form of strength Balance exercises - Yoga has a balance component.

## 2021-10-23 NOTE — Therapy (Signed)
[facility] [address] [place], [place], [address] Phone: [phone]   Fax:  [phone]  Physical Therapy Treatment  Patient Details  Name: Sheila Salazar MRN: [id number] Date of Birth: 1966/05/19 Referring Provider (PT): Tarry Kos MD   Encounter Date: 10/23/2021   PT End of Session - 10/23/21 0906     Visit Number 3    Number of Visits 16    Date for PT Re-Evaluation 12/05/21    PT Start Time 0849    PT Stop Time 0930    PT Time Calculation (min) 41 min    Activity Tolerance Patient tolerated treatment well;No increased pain    Behavior During Therapy WFL for tasks assessed/performed             Past Medical History:  Diagnosis Date   ADHD (attention deficit hyperactivity disorder)    Allergy    Anxiety    Depression    Headache    Hypertension     Past Surgical History:  Procedure Laterality Date   EYE SURGERY     TOTAL HIP ARTHROPLASTY Left 09/16/2021   Procedure: LEFT TOTAL HIP ARTHROPLASTY ANTERIOR APPROACH;  Surgeon: Tarry Kos, MD;  Location: [facility];  Service: Orthopedics;  Laterality: Left;    There were no vitals filed for this visit.   Subjective Assessment - 10/23/21 0849     Subjective She went back to work & has done stairs. She is flying to [place], [place] on 11/21 for 2 weeks. Pt requesting education on cane / walking stick and how to protect arthritic changes to limit need for surgeries in future.    Pertinent History Previous R lumbar radiculopathy and B knee pain    Limitations Walking;Lifting;House hold activities;Standing    How long can you sit comfortably? Start-up stiffness    How long can you stand comfortably? 30 minutes    How long can you walk comfortably? 10-15 minutes    Patient Stated Goals Return to work and be able to do stairs without a handrail.  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    Currently in Pain? Yes    Pain Score 3     Pain Location Hip    Pain Orientation Left     Pain Descriptors / Indicators Sore    Pain Onset 1 to 4 weeks ago    Pain Frequency Intermittent    Multiple Pain Sites Yes    Pain Score 5    Pain Location Knee    Pain Orientation Left    Pain Descriptors / Indicators Sharp    Pain Type Acute pain    Pain Onset 1 to 4 weeks ago    Aggravating Factors  standing & walking    Pain Relieving Factors sitting & resting                               [facility] Adult PT Treatment/Exercise - 10/23/21 0850       Ambulation/Gait   Ambulation/Gait Yes    Ambulation/Gait Assistance 5: Supervision    Ambulation/Gait Assistance Details PT educated on benefits to cane or walking stick for longer distances, uneven terrain & inclines. PT recommended walking program with cane to build up distance & time prior to trip to [place].  Pt verbalized understanding. PT educated in cane ht, placement & sequencing. pt return demo understanding.    Ambulation Distance (Feet) 300 Feet    Assistive  device Straight cane    Ambulation Surface Level;Indoor      Knee/Hip Exercises: Aerobic   Nustep LEs only level 6 for 10 min   PT educating on fitness plan while exercising                    PT Education - 10/23/21 1043     Education Details components of well-rounded fitness plan (see pt instructions) and importance of posture & form, traveling minimizing risk of DVT    Person(s) Educated Patient    Methods Explanation;Verbal cues    Comprehension Verbalized understanding              PT Short Term Goals - 10/10/21 0951       PT SHORT TERM GOAL #1   Title [name] will improve L hip AROM for flexion to 100 degrees, IR to 10 degrees and ER to 30 degrees.    Baseline 90/6/14 degrees    Time 4    Period Weeks    Status New    Target Date 11/07/21      PT SHORT TERM GOAL #2   Title [name] will report L hip pain consistently 0-4/10 on the Numeric Pain Rating Scale.    Baseline 6/10    Time 4    Period Weeks    Status New     Target Date 11/07/21      PT SHORT TERM GOAL #3   Title [name] will return to full-time work.    Baseline Out of work    Time 4    Period Weeks    Status New    Target Date 11/07/21               PT Long Term Goals - 10/10/21 0953       PT LONG TERM GOAL #1   Title Improve FOTO score to 63.    Baseline 33    Time 8    Period Weeks    Status New    Target Date 12/05/21      PT LONG TERM GOAL #2   Title [name] will report L hip pain consistently 0-2/10 on the Numeric Pain Rating Scale.    Baseline 6/10    Time 8    Period Weeks    Status New    Target Date 12/05/21      PT LONG TERM GOAL #3   Title Improve L hip strength for hip flexors and abductors to 5/5 MMT.    Baseline 3- to 3+/5 MMT    Time 8    Period Weeks    Status New    Target Date 12/05/21      PT LONG TERM GOAL #4   Title [name] will be independent with her long-term HEP at [place].    Baseline Prescribed today.    Time 8    Period Weeks    Status New    Target Date 12/05/21                   Plan - 10/23/21 0906     Clinical Impression Statement PT session spent educating pt per her request on exercise program for individuals with arthritis and she verbalized understanding.  PT educated in use of cane for longer distances, uneven terrain like cobblestones in [place] and inclines.  She appears to understand. Pt would benefit from further PT to progress strength, flexiblity, endurance & balance prior to planned trip to [place] later this  month.    Examination-Activity Limitations Stairs;Stand;Dressing;Bend;Lift;Locomotion Level;Squat    Examination-Participation Restrictions Occupation;Community Activity    Stability/Clinical Decision Making Stable/Uncomplicated    Rehab Potential Good    PT Frequency 2x / week    PT Duration 8 weeks    PT Treatment/Interventions ADLs/Self Care Home Management;Cryotherapy;Moist Heat;Gait training;Stair training;Functional mobility training;Therapeutic  activities;Neuromuscular re-education;Balance training;Therapeutic exercise;Patient/family education;Manual techniques    PT Next Visit Plan check STGs next week, continue to update HEP as indicated.  progressing balance and hip strength and functional work on stairs.    PT Home Exercise Plan [email or web address]    Consulted and Agree with Plan of Care Patient             Patient will benefit from skilled therapeutic intervention in order to improve the following deficits and impairments:  Abnormal gait, Decreased balance, Decreased endurance, Decreased range of motion, Difficulty walking, Decreased strength, Increased edema, Impaired flexibility, Pain  Visit Diagnosis: Difficulty walking  Muscle weakness (generalized)  Pain in left hip  Stiffness of left hip, not elsewhere classified  Localized edema     Problem List Patient Active Problem List   Diagnosis Date Noted   Primary osteoarthritis of left hip 09/16/2021   Status post total replacement of left hip 09/16/2021   Nonallopathic lesion of sacral region 07/27/2018   Nonallopathic lesion of rib cage 07/27/2018   Patellofemoral syndrome of both knees 02/10/2017   Right lumbar radiculopathy 03/06/2015   SI (sacroiliac) joint dysfunction 08/25/2014   Abdominal pain, chronic, right lower quadrant 04/04/2014   Hip flexor tightness 02/22/2014   Neck muscle spasm 01/31/2014   Nonallopathic lesion of cervical region 01/31/2014   Nonallopathic lesion of thoracic region 01/31/2014   Nonallopathic lesion of lumbosacral region 01/31/2014    Vladimir Faster, PT, DPT 10/23/2021, 10:53 AM  [facility] [address] [place], [place], [address] Phone: [phone]   Fax:  [phone]  Name: Joel Mericle MRN: [id number] Date of Birth: [birth year]

## 2021-10-23 NOTE — Telephone Encounter (Signed)
Received medical records release form,S/T disability form and $25.00 cash    Forwarding to [place] today

## 2021-10-25 ENCOUNTER — Other Ambulatory Visit: Payer: Self-pay

## 2021-10-25 ENCOUNTER — Encounter: Payer: Self-pay | Admitting: Rehabilitative and Restorative Service Providers"

## 2021-10-25 ENCOUNTER — Ambulatory Visit (INDEPENDENT_AMBULATORY_CARE_PROVIDER_SITE_OTHER): Payer: BC Managed Care – PPO | Admitting: Rehabilitative and Restorative Service Providers"

## 2021-10-25 ENCOUNTER — Other Ambulatory Visit: Payer: Self-pay | Admitting: Physician Assistant

## 2021-10-25 DIAGNOSIS — R600 Localized edema: Secondary | ICD-10-CM

## 2021-10-25 DIAGNOSIS — M6281 Muscle weakness (generalized): Secondary | ICD-10-CM

## 2021-10-25 DIAGNOSIS — M25552 Pain in left hip: Secondary | ICD-10-CM

## 2021-10-25 DIAGNOSIS — R262 Difficulty in walking, not elsewhere classified: Secondary | ICD-10-CM

## 2021-10-25 DIAGNOSIS — M25652 Stiffness of left hip, not elsewhere classified: Secondary | ICD-10-CM

## 2021-10-25 NOTE — Therapy (Signed)
[facility] [place], [place], [address] Phone: [phone]   Fax:  [phone]  Physical Therapy Treatment  Patient Details  Name: Sheila Salazar MRN: [id number] Date of Birth: 1966-02-17 Referring Provider (PT): Leandrew Koyanagi MD   Encounter Date: 10/25/2021   PT End of Session - 10/25/21 1738     Visit Number 4    Number of Visits 16    Date for PT Re-Evaluation 12/05/21    PT Start Time 0973    PT Stop Time 1102    PT Time Calculation (min) 39 min    Activity Tolerance Patient tolerated treatment well;No increased pain    Behavior During Therapy WFL for tasks assessed/performed             Past Medical History:  Diagnosis Date   ADHD (attention deficit hyperactivity disorder)    Allergy    Anxiety    Depression    Headache    Hypertension     Past Surgical History:  Procedure Laterality Date   EYE SURGERY     TOTAL HIP ARTHROPLASTY Left 09/16/2021   Procedure: LEFT TOTAL HIP ARTHROPLASTY ANTERIOR APPROACH;  Surgeon: Leandrew Koyanagi, MD;  Location: [place];  Service: Orthopedics;  Laterality: Left;    There were no vitals filed for this visit.   Subjective Assessment - 10/25/21 1733     Subjective Maleigh has been feeling great since starting an anti-inflammatory.  L hip soreness and tiredness is noted at day's end.    Pertinent History Previous R lumbar radiculopathy and B knee pain    Limitations Walking;Lifting;House hold activities;Standing    How long can you sit comfortably? Start-up stiffness    How long can you stand comfortably? 30 minutes    How long can you walk comfortably? 10-15 minutes    Patient Stated Goals Return to work and be able to do stairs without a handrail.  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    Currently in Pain? Yes    Pain Score 2     Pain Location Hip    Pain Orientation Left    Pain Descriptors / Indicators Tiring    Pain Radiating Towards NA    Pain Onset More than a month ago     Pain Frequency Intermittent    Aggravating Factors  Late day activities due to fatigue.  Too much WB is difficult.    Pain Relieving Factors Anti-inflammatory.    Effect of Pain on Daily Activities Has not returned to normal workouts or full work activities.    Multiple Pain Sites No    Pain Onset 1 to 4 weeks ago                [facility] PT Assessment - 10/25/21 0001       AROM   Overall AROM  Deficits    AROM Assessment Site Hip    Right/Left Hip Left    Left Hip Flexion 95    Left Hip External Rotation  30    Left Hip Internal Rotation  10      Flexibility   Soft Tissue Assessment /Muscle Length yes    Hamstrings 45 degrees L (was 40)                           [facility] Adult PT Treatment/Exercise - 10/25/21 0001       Therapeutic Activites    Therapeutic Activities Other Therapeutic  Activities    Other Therapeutic Activities Dynamic balance on wobble board 1 minute each side to side and balance.  Step-up and over 8 inch step with slow eccentrics      Neuro Re-ed    Neuro Re-ed Details  Tandem balance eyes open and closed; single leg stance eyes open 3X 20 seconds each      Exercises   Exercises Knee/Hip      Knee/Hip Exercises: Stretches   Active Hamstring Stretch Both;4 reps;20 seconds    Hip Flexor Stretch 4 reps;20 seconds    Hip Flexor Stretch Limitations Other leg straight    Piriformis Stretch Both;4 reps;20 seconds    Piriformis Stretch Limitations Figure 4 and push knee away from body      Knee/Hip Exercises: Standing   Other Standing Knee Exercises Alternating hip hike 2 sets of 10X 3 seconds & hip abduction with pelvic stabilization 2 sets of 5 each for 3 seconds    Other Standing Knee Exercises Hip flexion Isometrics into door frame 5X 5 seconds      Knee/Hip Exercises: Seated   Other Seated Knee/Hip Exercises Hip flexors Isometrics 5X 5 seconds                     PT Education - 10/25/21 1736     Education Details  Reviewed and progressed HEP, balance and functional challenges.    Person(s) Educated Patient    Methods Explanation;Handout;Demonstration;Tactile cues;Verbal cues    Comprehension Verbalized understanding;Tactile cues required;Returned demonstration;Need further instruction;Verbal cues required              PT Short Term Goals - 10/25/21 1737       PT SHORT TERM GOAL #1   Title Abigail Butts will improve L hip AROM for flexion to 100 degrees, IR to 10 degrees and ER to 30 degrees.    Baseline 95/10/30 degrees (was 90/6/14)    Time 4    Period Weeks    Status Partially Met    Target Date 11/07/21      PT SHORT TERM GOAL #2   Title Abigail Butts will report L hip pain consistently 0-4/10 on the Numeric Pain Rating Scale.    Baseline 6/10 at evaluation    Time 4    Period Weeks    Status Achieved    Target Date 11/07/21      PT SHORT TERM GOAL #3   Title Abigail Butts will return to full-time work.    Baseline Working modified duty    Time 4    Period Weeks    Status Partially Met    Target Date 11/07/21               PT Long Term Goals - 10/10/21 0953       PT LONG TERM GOAL #1   Title Improve FOTO score to 63.    Baseline 33    Time 8    Period Weeks    Status New    Target Date 12/05/21      PT LONG TERM GOAL #2   Title Abigail Butts will report L hip pain consistently 0-2/10 on the Numeric Pain Rating Scale.    Baseline 6/10    Time 8    Period Weeks    Status New    Target Date 12/05/21      PT LONG TERM GOAL #3   Title Improve L hip strength for hip flexors and abductors to 5/5 MMT.    Baseline  3- to 3+/5 MMT    Time 8    Period Weeks    Status New    Target Date 12/05/21      PT LONG TERM GOAL #4   Title Abigail Butts will be independent with her long-term HEP at [place].    Baseline Prescribed today.    Time 8    Period Weeks    Status New    Target Date 12/05/21                   Plan - 10/25/21 1738     Clinical Impression Statement Angles is making progress  towards short and long-term goals established at evaluation.  Hip abductors and hip flexors weakness are a high priority with her home and clinic programs.  Balance and functional activities are also high priority to return Joe to her PLOF without restriction.  She is on target to meet LTGs with the recommended POC.    Examination-Activity Limitations Stairs;Stand;Dressing;Bend;Lift;Locomotion Level;Squat    Examination-Participation Restrictions Occupation;Community Activity    Stability/Clinical Decision Making Stable/Uncomplicated    Rehab Potential Good    PT Frequency 2x / week    PT Duration 8 weeks    PT Treatment/Interventions ADLs/Self Care Home Management;Cryotherapy;Moist Heat;Gait training;Stair training;Functional mobility training;Therapeutic activities;Neuromuscular re-education;Balance training;Therapeutic exercise;Patient/family education;Manual techniques    PT Next Visit Plan Hip flexors and hip abductors strength, balance and function (hip flexion, stairs, etc...)    PT Home Exercise Plan [email or web address]    Consulted and Agree with Plan of Care Patient             Patient will benefit from skilled therapeutic intervention in order to improve the following deficits and impairments:  Abnormal gait, Decreased balance, Decreased endurance, Decreased range of motion, Difficulty walking, Decreased strength, Increased edema, Impaired flexibility, Pain  Visit Diagnosis: Difficulty walking  Muscle weakness (generalized)  Pain in left hip  Stiffness of left hip, not elsewhere classified  Localized edema     Problem List Patient Active Problem List   Diagnosis Date Noted   Primary osteoarthritis of left hip 09/16/2021   Status post total replacement of left hip 09/16/2021   Nonallopathic lesion of sacral region 07/27/2018   Nonallopathic lesion of rib cage 07/27/2018   Patellofemoral syndrome of both knees 02/10/2017   Right lumbar radiculopathy 03/06/2015   SI  (sacroiliac) joint dysfunction 08/25/2014   Abdominal pain, chronic, right lower quadrant 04/04/2014   Hip flexor tightness 02/22/2014   Neck muscle spasm 01/31/2014   Nonallopathic lesion of cervical region 01/31/2014   Nonallopathic lesion of thoracic region 01/31/2014   Nonallopathic lesion of lumbosacral region 01/31/2014    Farley Ly, PT, MPT 10/25/2021, 5:42 PM  [facility] [address] [place], [place], [address] Phone: [phone]   Fax:  [phone]  Name: Shamiah Kahler MRN: [id number] Date of Birth: February 26, 1966

## 2021-10-28 ENCOUNTER — Telehealth: Payer: Self-pay | Admitting: Orthopaedic Surgery

## 2021-10-28 NOTE — Telephone Encounter (Signed)
Pt called stating her anti -inflammatory meds are making her sick and need to chance medication. Please cal pt about this matter at [phone].

## 2021-10-29 ENCOUNTER — Encounter: Payer: BC Managed Care – PPO | Admitting: Orthopaedic Surgery

## 2021-10-29 NOTE — Telephone Encounter (Signed)
Best to stop for now.  If not having reflux/abdominal pain/bleeding type symptoms, we can change nsaids.  Let me know

## 2021-10-29 NOTE — Telephone Encounter (Signed)
Sent mychart msg.

## 2021-10-30 ENCOUNTER — Encounter: Payer: BC Managed Care – PPO | Admitting: Rehabilitative and Restorative Service Providers"

## 2021-10-30 ENCOUNTER — Other Ambulatory Visit: Payer: Self-pay | Admitting: Orthopaedic Surgery

## 2021-10-30 MED ORDER — TRAMADOL HCL 50 MG PO TABS: 50.0000 mg | ORAL_TABLET | Freq: Two times a day (BID) | ORAL | 0 refills | Status: DC | PRN

## 2021-11-01 ENCOUNTER — Ambulatory Visit (INDEPENDENT_AMBULATORY_CARE_PROVIDER_SITE_OTHER): Payer: BC Managed Care – PPO | Admitting: Rehabilitative and Restorative Service Providers"

## 2021-11-01 ENCOUNTER — Encounter: Payer: Self-pay | Admitting: Rehabilitative and Restorative Service Providers"

## 2021-11-01 ENCOUNTER — Other Ambulatory Visit: Payer: Self-pay

## 2021-11-01 ENCOUNTER — Other Ambulatory Visit: Payer: Self-pay | Admitting: Physician Assistant

## 2021-11-01 DIAGNOSIS — M6281 Muscle weakness (generalized): Secondary | ICD-10-CM | POA: Diagnosis not present

## 2021-11-01 DIAGNOSIS — M25552 Pain in left hip: Secondary | ICD-10-CM | POA: Diagnosis not present

## 2021-11-01 DIAGNOSIS — R262 Difficulty in walking, not elsewhere classified: Secondary | ICD-10-CM

## 2021-11-01 DIAGNOSIS — M25652 Stiffness of left hip, not elsewhere classified: Secondary | ICD-10-CM | POA: Diagnosis not present

## 2021-11-01 DIAGNOSIS — R600 Localized edema: Secondary | ICD-10-CM

## 2021-11-01 MED ORDER — HYDROCODONE-ACETAMINOPHEN 5-325 MG PO TABS: 1.0000 | ORAL_TABLET | Freq: Four times a day (QID) | ORAL | 0 refills | Status: DC | PRN

## 2021-11-01 MED ORDER — MELOXICAM 7.5 MG PO TABS: 7.5000 mg | ORAL_TABLET | Freq: Two times a day (BID) | ORAL | 2 refills | Status: DC | PRN

## 2021-11-01 NOTE — Patient Instructions (Signed)
Access Code: [email or web address] URL: [email or web address] Date: 11/01/2021 Prepared by: Pauletta Browns  Exercises Supine Figure 4 Piriformis Stretch - 2 x daily - 7 x weekly - 1 sets - 5 reps - 20 seconds hold Standing Hip Hiking - 2 x daily - 7 x weekly - 2 sets - 10 reps - 3 seconds hold Tandem Stance - 2 x daily - 7 x weekly - 1 sets - 4 reps - 20 second hold Standing Lumbar Extension at Wall - Forearms - 5 x daily - 7 x weekly - 1 sets - 5 reps - 3 seconds hold Prone Alternating Arm and Leg Lifts - 2 x daily - 7 x weekly - 2 sets - 10 reps - 3-10 seconds hold Single Knee to Chest Stretch - 2 x daily - 7 x weekly - 1 sets - 5 reps - 20 seconds hold Supine Hamstring Stretch - 2 x daily - 7 x weekly - 1 sets - 5 reps - 20 seconds hold

## 2021-11-01 NOTE — Telephone Encounter (Signed)
I sent in mobic and weaning to [facility] for pain meds.  For her trip to [place], please have her start taking a baby aspirin twice daily starting two days prior to her trip there in addition to two days prior to her trip home

## 2021-11-01 NOTE — Therapy (Signed)
[facility] [place], [place], [address] Phone: [phone]   Fax:  [phone]  Physical Therapy Treatment  Patient Details  Name: Sheila Salazar MRN: [id number] Date of Birth: 09-May-1966 Referring Provider (PT): Leandrew Koyanagi MD   Encounter Date: 11/01/2021   PT End of Session - 11/01/21 1504     Visit Number 5    Number of Visits 16    Date for PT Re-Evaluation 12/05/21    PT Start Time 0933    PT Stop Time 1016    PT Time Calculation (min) 43 min    Activity Tolerance Patient tolerated treatment well;No increased pain    Behavior During Therapy WFL for tasks assessed/performed             Past Medical History:  Diagnosis Date   ADHD (attention deficit hyperactivity disorder)    Allergy    Anxiety    Depression    Headache    Hypertension     Past Surgical History:  Procedure Laterality Date   EYE SURGERY     TOTAL HIP ARTHROPLASTY Left 09/16/2021   Procedure: LEFT TOTAL HIP ARTHROPLASTY ANTERIOR APPROACH;  Surgeon: Leandrew Koyanagi, MD;  Location: [facility];  Service: Orthopedics;  Laterality: Left;    There were no vitals filed for this visit.   Subjective Assessment - 11/01/21 1500     Subjective Sheila Salazar reports some negative side affects from her new anti-inflammatory.  She will be following-up with Dr. Erlinda Hong to see if there might be another option before her [place] trip next week.    Pertinent History Previous R lumbar radiculopathy and B knee pain    Limitations Walking;Lifting;House hold activities;Standing    How long can you sit comfortably? Start-up stiffness    How long can you stand comfortably? 30 minutes    How long can you walk comfortably? 10-15 minutes    Patient Stated Goals Return to work and be able to do stairs without a handrail.  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    Currently in Pain? Yes    Pain Score 3     Pain Location Hip    Pain Orientation Left    Pain Descriptors / Indicators  Aching;Sore    Pain Type Surgical pain    Pain Radiating Towards NA    Pain Onset More than a month ago    Pain Frequency Intermittent    Aggravating Factors  Prolonged postures and prolonged standing and walking.    Pain Relieving Factors Anti-inflammatory    Effect of Pain on Daily Activities Limited due to pain and fatigue with late day function, prolonged standing and walking and has not returned to her normal workouts.    Pain Onset 1 to 4 weeks ago                               [facility] Adult PT Treatment/Exercise - 11/01/21 0001       Neuro Re-ed    Neuro Re-ed Details  Tandem balance eyes open and head moving side to side and eyes closed; single leg stance head moving side to side 3X 20 seconds each      Exercises   Exercises Knee/Hip      Knee/Hip Exercises: Stretches   Active Hamstring Stretch Both;4 reps;20 seconds    Hip Flexor Stretch 4 reps;20 seconds    Hip Flexor Stretch Limitations Other leg straight  Piriformis Stretch Both;4 reps;20 seconds    Piriformis Stretch Limitations Figure 4 and push knee away from body    Other Knee/Hip Stretches Trunk/lumbar extension AROM 10X 3 seconds (hips forward)      Knee/Hip Exercises: Standing   Other Standing Knee Exercises Alternating hip hike 2 sets of 10X 3 seconds    Other Standing Knee Exercises Hip flexion Isometrics into door frame & seated 10X 5 seconds      Knee/Hip Exercises: Seated   Other Seated Knee/Hip Exercises Hip flexors Isometrics 10X 5 seconds      Knee/Hip Exercises: Prone   Straight Leg Raises Strengthening;Both;10 reps;Limitations    Straight Leg Raises Limitations 3 seconds (toes pulled back)    Other Prone Exercises Prone alternating arm and leg extensions 10X 3 seconds                     PT Education - 11/01/21 1503     Education Details Progressed some hip and low back strengthening as Sheila Salazar notes increased back pain with prolonged postures.    Person(s)  Educated Patient    Methods Explanation;Demonstration;Verbal cues;Handout    Comprehension Verbalized understanding;Need further instruction;Returned demonstration;Verbal cues required              PT Short Term Goals - 10/25/21 1737       PT SHORT TERM GOAL #1   Title Sheila Salazar will improve L hip AROM for flexion to 100 degrees, IR to 10 degrees and ER to 30 degrees.    Baseline 95/10/30 degrees (was 90/6/14)    Time 4    Period Weeks    Status Partially Met    Target Date 11/07/21      PT SHORT TERM GOAL #2   Title Sheila Salazar will report L hip pain consistently 0-4/10 on the Numeric Pain Rating Scale.    Baseline 6/10 at evaluation    Time 4    Period Weeks    Status Achieved    Target Date 11/07/21      PT SHORT TERM GOAL #3   Title Sheila Salazar will return to full-time work.    Baseline Working modified duty    Time 4    Period Weeks    Status Partially Met    Target Date 11/07/21               PT Long Term Goals - 10/10/21 0953       PT LONG TERM GOAL #1   Title Improve FOTO score to 63.    Baseline 33    Time 8    Period Weeks    Status New    Target Date 12/05/21      PT LONG TERM GOAL #2   Title Sheila Salazar will report L hip pain consistently 0-2/10 on the Numeric Pain Rating Scale.    Baseline 6/10    Time 8    Period Weeks    Status New    Target Date 12/05/21      PT LONG TERM GOAL #3   Title Improve L hip strength for hip flexors and abductors to 5/5 MMT.    Baseline 3- to 3+/5 MMT    Time 8    Period Weeks    Status New    Target Date 12/05/21      PT LONG TERM GOAL #4   Title Sheila Salazar will be independent with her long-term HEP at [place].    Baseline Prescribed today.    Time  8    Period Weeks    Status New    Target Date 12/05/21                   Plan - 11/01/21 1504     Clinical Impression Statement L hip abductors strengthening remains a high priority.  Low back strengthening was also progressed due to her complaints of back pain late in  the day and with fatigue.  Sheila Salazar is headed out of town next Sunday to [place] and will be objectively reassessed next week.  Hip abductors strength remains the primary deficit and is a focus of her home and clinic program.    Examination-Activity Limitations Stairs;Stand;Dressing;Bend;Lift;Locomotion Level;Squat    Examination-Participation Restrictions Occupation;Community Activity    Stability/Clinical Decision Making Stable/Uncomplicated    Rehab Potential Good    PT Frequency 2x / week    PT Duration 8 weeks    PT Treatment/Interventions ADLs/Self Care Home Management;Cryotherapy;Moist Heat;Gait training;Stair training;Functional mobility training;Therapeutic activities;Neuromuscular re-education;Balance training;Therapeutic exercise;Patient/family education;Manual techniques    PT Next Visit Plan Hip flexors and hip abductors strength, balance and function (hip flexion, stairs, etc...).  FOTO/RA.    PT Home Exercise Plan [email or web address]    Consulted and Agree with Plan of Care Patient             Patient will benefit from skilled therapeutic intervention in order to improve the following deficits and impairments:  Abnormal gait, Decreased balance, Decreased endurance, Decreased range of motion, Difficulty walking, Decreased strength, Increased edema, Impaired flexibility, Pain  Visit Diagnosis: Difficulty walking  Muscle weakness (generalized)  Pain in left hip  Stiffness of left hip, not elsewhere classified  Localized edema     Problem List Patient Active Problem List   Diagnosis Date Noted   Primary osteoarthritis of left hip 09/16/2021   Status post total replacement of left hip 09/16/2021   Nonallopathic lesion of sacral region 07/27/2018   Nonallopathic lesion of rib cage 07/27/2018   Patellofemoral syndrome of both knees 02/10/2017   Right lumbar radiculopathy 03/06/2015   SI (sacroiliac) joint dysfunction 08/25/2014   Abdominal pain, chronic, right lower quadrant  04/04/2014   Hip flexor tightness 02/22/2014   Neck muscle spasm 01/31/2014   Nonallopathic lesion of cervical region 01/31/2014   Nonallopathic lesion of thoracic region 01/31/2014   Nonallopathic lesion of lumbosacral region 01/31/2014    Farley Ly, PT, MPT 11/01/2021, 3:07 PM  [facility] [address] [place], [place], [address] Phone: [phone]   Fax:  [phone]  Name: Sheila Salazar MRN: [id number] Date of Birth: 15-Nov-1966

## 2021-11-06 ENCOUNTER — Ambulatory Visit (INDEPENDENT_AMBULATORY_CARE_PROVIDER_SITE_OTHER): Payer: BC Managed Care – PPO | Admitting: Rehabilitative and Restorative Service Providers"

## 2021-11-06 ENCOUNTER — Other Ambulatory Visit: Payer: Self-pay

## 2021-11-06 ENCOUNTER — Encounter: Payer: Self-pay | Admitting: Rehabilitative and Restorative Service Providers"

## 2021-11-06 DIAGNOSIS — R262 Difficulty in walking, not elsewhere classified: Secondary | ICD-10-CM

## 2021-11-06 DIAGNOSIS — M25552 Pain in left hip: Secondary | ICD-10-CM

## 2021-11-06 DIAGNOSIS — M25652 Stiffness of left hip, not elsewhere classified: Secondary | ICD-10-CM

## 2021-11-06 DIAGNOSIS — R600 Localized edema: Secondary | ICD-10-CM

## 2021-11-06 DIAGNOSIS — M6281 Muscle weakness (generalized): Secondary | ICD-10-CM

## 2021-11-06 NOTE — Patient Instructions (Signed)
Continue current HEP with addition of hip abduction with pelvic stabilization

## 2021-11-06 NOTE — Therapy (Addendum)
[facility] [address] [place], [place], [address] Phone: [phone]   Fax:  [phone]  Physical Therapy Treatment/DC  Patient Details  Name: Sheila Salazar MRN: [id number] Date of Birth: 1966/12/05 Referring Provider (PT): Leandrew Koyanagi MD  PHYSICAL THERAPY DISCHARGE SUMMARY  Visits from Start of Care: 6  Current functional level related to goals / functional outcomes: See note   Remaining deficits: See note   Education / Equipment: HEP   Patient agrees to discharge. Patient goals were partially met. Patient is being discharged due to not returning since the last visit.   Encounter Date: 11/06/2021   PT End of Session - 11/06/21 1738     Visit Number 6    Number of Visits 16    Date for PT Re-Evaluation 12/05/21    PT Start Time 7867    PT Stop Time 1515    PT Time Calculation (min) 43 min    Activity Tolerance Patient tolerated treatment well;No increased pain    Behavior During Therapy WFL for tasks assessed/performed             Past Medical History:  Diagnosis Date   ADHD (attention deficit hyperactivity disorder)    Allergy    Anxiety    Depression    Headache    Hypertension     Past Surgical History:  Procedure Laterality Date   EYE SURGERY     TOTAL HIP ARTHROPLASTY Left 09/16/2021   Procedure: LEFT TOTAL HIP ARTHROPLASTY ANTERIOR APPROACH;  Surgeon: Leandrew Koyanagi, MD;  Location: [facility];  Service: Orthopedics;  Laterality: Left;    There were no vitals filed for this visit.   Subjective Assessment - 11/06/21 1734     Subjective Sheila Salazar is feeling confident about her upcoming [place] trip and her ability to walk and do stairs.    Pertinent History Previous R lumbar radiculopathy and B knee pain    Limitations Walking;Lifting;House hold activities;Standing    How long can you sit comfortably? Start-up stiffness    How long can you stand comfortably? 30 minutes    How long can you walk comfortably? 10-15  minutes    Patient Stated Goals Return to work and be able to do stairs without a handrail.  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    Currently in Pain? Yes    Pain Score 2     Pain Location Hip    Pain Orientation Left    Pain Descriptors / Indicators Aching;Sore    Pain Type Surgical pain    Pain Radiating Towards NA    Pain Onset More than a month ago    Pain Frequency Occasional    Aggravating Factors  Too much activity (particularly WB)    Pain Relieving Factors Anti-inflammatory    Effect of Pain on Daily Activities Not doing her normal workouts, late day fatigue with standing and walking alot.    Multiple Pain Sites No    Pain Onset 1 to 4 weeks ago                               [facility] Adult PT Treatment/Exercise - 11/06/21 0001       Neuro Re-ed    Neuro Re-ed Details  Tandem balance eyes open and eyes closed; single leg stance pelvis level with eyes open 3X 20 seconds each      Exercises   Exercises Knee/Hip  Knee/Hip Exercises: Stretches   Active Hamstring Stretch Both;2 reps;20 seconds    Hip Flexor Stretch Both;2 reps;20 seconds    Hip Flexor Stretch Limitations Other leg straight    Piriformis Stretch Both;2 reps;20 seconds    Piriformis Stretch Limitations Figure 4 and push knee away from body    Other Knee/Hip Stretches Trunk/lumbar extension AROM 10X 3 seconds (hips forward)      Knee/Hip Exercises: Aerobic   Nustep Level 7 for 5 minutes      Knee/Hip Exercises: Standing   Other Standing Knee Exercises Alternating hip hike 2 sets of 10X 3 seconds & hip abduction with pelvic stabilization 2 sets of 5 for 5 seconds (shoulder against wall to avoid lateral lean)    Other Standing Knee Exercises Hip flexion Isometrics into door frame & seated 10X 5 seconds      Knee/Hip Exercises: Seated   Other Seated Knee/Hip Exercises Hip flexors Isometrics 10X 5 seconds      Knee/Hip Exercises: Prone   Straight Leg Raises Strengthening;Both;10  reps;Limitations    Straight Leg Raises Limitations 5 seconds (toes pulled back)    Other Prone Exercises Prone alternating arm and leg extensions 10X 5 seconds                     PT Education - 11/06/21 1737     Education Details Reviewed HEP with emphasis on hip abductors strength.    Person(s) Educated Patient    Methods Explanation;Demonstration;Tactile cues;Verbal cues;Handout    Comprehension Verbalized understanding;Tactile cues required;Need further instruction;Returned demonstration;Verbal cues required              PT Short Term Goals - 10/25/21 1737       PT SHORT TERM GOAL #1   Title Sheila Salazar will improve L hip AROM for flexion to 100 degrees, IR to 10 degrees and ER to 30 degrees.    Baseline 95/10/30 degrees (was 90/6/14)    Time 4    Period Weeks    Status Partially Met    Target Date 11/07/21      PT SHORT TERM GOAL #2   Title Sheila Salazar will report L hip pain consistently 0-4/10 on the Numeric Pain Rating Scale.    Baseline 6/10 at evaluation    Time 4    Period Weeks    Status Achieved    Target Date 11/07/21      PT SHORT TERM GOAL #3   Title Sheila Salazar will return to full-time work.    Baseline Working modified duty    Time 4    Period Weeks    Status Partially Met    Target Date 11/07/21               PT Long Term Goals - 10/10/21 0953       PT LONG TERM GOAL #1   Title Improve FOTO score to 63.    Baseline 33    Time 8    Period Weeks    Status New    Target Date 12/05/21      PT LONG TERM GOAL #2   Title Sheila Salazar will report L hip pain consistently 0-2/10 on the Numeric Pain Rating Scale.    Baseline 6/10    Time 8    Period Weeks    Status New    Target Date 12/05/21      PT LONG TERM GOAL #3   Title Improve L hip strength for hip flexors and abductors to  5/5 MMT.    Baseline 3- to 3+/5 MMT    Time 8    Period Weeks    Status New    Target Date 12/05/21      PT LONG TERM GOAL #4   Title Sheila Salazar will be independent with  her long-term HEP at [place].    Baseline Prescribed today.    Time 8    Period Weeks    Status New    Target Date 12/05/21                   Plan - 11/06/21 1738     Clinical Impression Statement Sheila Salazar is feeling good about her upcoming [place] trip and her ability to walk alot on her trip.  She will be reassessed next visit with (likely) 1-2 visits left to review and updated her HEP before transfer into independent PT if goals are met.    Examination-Activity Limitations Stairs;Stand;Dressing;Bend;Lift;Locomotion Level;Squat    Examination-Participation Restrictions Occupation;Community Activity    Stability/Clinical Decision Making Stable/Uncomplicated    Rehab Potential Good    PT Frequency 2x / week    PT Duration 8 weeks    PT Treatment/Interventions ADLs/Self Care Home Management;Cryotherapy;Moist Heat;Gait training;Stair training;Functional mobility training;Therapeutic activities;Neuromuscular re-education;Balance training;Therapeutic exercise;Patient/family education;Manual techniques    PT Next Visit Plan Hip flexors and hip abductors strength, balance and function (hip flexion, stairs, etc...).  FOTO/RA.    PT Home Exercise Plan [email or web address]    Consulted and Agree with Plan of Care Patient             Patient will benefit from skilled therapeutic intervention in order to improve the following deficits and impairments:  Abnormal gait, Decreased balance, Decreased endurance, Decreased range of motion, Difficulty walking, Decreased strength, Increased edema, Impaired flexibility, Pain  Visit Diagnosis: Difficulty walking  Muscle weakness (generalized)  Stiffness of left hip, not elsewhere classified  Pain in left hip  Localized edema     Problem List Patient Active Problem List   Diagnosis Date Noted   Primary osteoarthritis of left hip 09/16/2021   Status post total replacement of left hip 09/16/2021   Nonallopathic lesion of sacral region 07/27/2018    Nonallopathic lesion of rib cage 07/27/2018   Patellofemoral syndrome of both knees 02/10/2017   Right lumbar radiculopathy 03/06/2015   SI (sacroiliac) joint dysfunction 08/25/2014   Abdominal pain, chronic, right lower quadrant 04/04/2014   Hip flexor tightness 02/22/2014   Neck muscle spasm 01/31/2014   Nonallopathic lesion of cervical region 01/31/2014   Nonallopathic lesion of thoracic region 01/31/2014   Nonallopathic lesion of lumbosacral region 01/31/2014    Farley Ly, PT, MPT 11/06/2021, 5:40 PM  [facility] Physical [facility] [address] [place], [place], [address] Phone: [phone]   Fax:  [phone]  Name: Sheila Salazar MRN: [id number] Date of Birth: 1966/03/01

## 2021-11-08 ENCOUNTER — Encounter: Payer: BC Managed Care – PPO | Admitting: Rehabilitative and Restorative Service Providers"

## 2021-11-22 ENCOUNTER — Other Ambulatory Visit: Payer: Self-pay | Admitting: Physician Assistant

## 2021-11-22 MED ORDER — AMOXICILLIN 500 MG PO CAPS: ORAL_CAPSULE | ORAL | 2 refills | Status: DC

## 2021-11-22 NOTE — Telephone Encounter (Signed)
Sent in amoxicillin

## 2021-11-28 ENCOUNTER — Encounter: Payer: BC Managed Care – PPO | Admitting: Rehabilitative and Restorative Service Providers"

## 2021-11-28 DIAGNOSIS — Z01419 Encounter for gynecological examination (general) (routine) without abnormal findings: Secondary | ICD-10-CM | POA: Diagnosis not present

## 2021-11-28 DIAGNOSIS — Z6824 Body mass index (BMI) 24.0-24.9, adult: Secondary | ICD-10-CM | POA: Diagnosis not present

## 2021-12-03 ENCOUNTER — Encounter: Payer: BC Managed Care – PPO | Admitting: Orthopaedic Surgery

## 2021-12-05 DIAGNOSIS — G4700 Insomnia, unspecified: Secondary | ICD-10-CM | POA: Diagnosis not present

## 2021-12-05 DIAGNOSIS — F419 Anxiety disorder, unspecified: Secondary | ICD-10-CM | POA: Diagnosis not present

## 2021-12-05 DIAGNOSIS — F339 Major depressive disorder, recurrent, unspecified: Secondary | ICD-10-CM | POA: Diagnosis not present

## 2021-12-05 DIAGNOSIS — F988 Other specified behavioral and emotional disorders with onset usually occurring in childhood and adolescence: Secondary | ICD-10-CM | POA: Diagnosis not present

## 2021-12-06 DIAGNOSIS — M67811 Other specified disorders of synovium, right shoulder: Secondary | ICD-10-CM | POA: Diagnosis not present

## 2021-12-06 DIAGNOSIS — M7551 Bursitis of right shoulder: Secondary | ICD-10-CM | POA: Diagnosis not present

## 2021-12-06 DIAGNOSIS — M25511 Pain in right shoulder: Secondary | ICD-10-CM | POA: Diagnosis not present

## 2021-12-10 ENCOUNTER — Encounter: Payer: BC Managed Care – PPO | Admitting: Orthopaedic Surgery

## 2021-12-25 ENCOUNTER — Ambulatory Visit (INDEPENDENT_AMBULATORY_CARE_PROVIDER_SITE_OTHER): Payer: BC Managed Care – PPO | Admitting: Orthopaedic Surgery

## 2021-12-25 ENCOUNTER — Other Ambulatory Visit: Payer: Self-pay

## 2021-12-25 ENCOUNTER — Encounter: Payer: Self-pay | Admitting: Orthopaedic Surgery

## 2021-12-25 DIAGNOSIS — Z96642 Presence of left artificial hip joint: Secondary | ICD-10-CM

## 2021-12-25 NOTE — Progress Notes (Signed)
° °  Post-Op Visit Note   Patient: Sheila Salazar           Date of Birth: 01/16/1966           MRN: [id number] Visit Date: 12/25/2021 PCP: Patient, No Pcp Per (Inactive)   Assessment & Plan:  Chief Complaint:  Chief Complaint  Patient presents with   Left Hip - Pain, Follow-up   Visit Diagnoses:  1. Status post total replacement of left hip     Plan: Sheila Salazar is now 3 months status post left total hip replacement.  Overall doing well.  No real complaints other than some slight weakness with hip flexion while seated.  She has returned back to work.   Left hip bikini scar is well-healed.  No signs of infection.  Excellent range of motion of the hip without pain.  Leg lengths are equal.  Gait and ambulation are normal.  Sheila Salazar has done very well from the surgery and she is pleased with her outcome.  She can increase activity as tolerated.  I think is fine to ice skate as long as she is very careful and she feels comfortable engaging in that activity.  We will recheck her in 9 months for 1 year visit with standing AP pelvis and lateral hip x-rays.  Dental prophylaxis reinforced.  If she has any problems in the meantime she will certainly follow-up with [place].  Follow-Up Instructions: No follow-ups on file.   Orders:  No orders of the defined types were placed in this encounter.  No orders of the defined types were placed in this encounter.   Imaging: No results found.  PMFS History: Patient Active Problem List   Diagnosis Date Noted   Primary osteoarthritis of left hip 09/16/2021   Status post total replacement of left hip 09/16/2021   Nonallopathic lesion of sacral region 07/27/2018   Nonallopathic lesion of rib cage 07/27/2018   Patellofemoral syndrome of both knees 02/10/2017   Right lumbar radiculopathy 03/06/2015   SI (sacroiliac) joint dysfunction 08/25/2014   Abdominal pain, chronic, right lower quadrant 04/04/2014   Hip flexor tightness 02/22/2014   Neck muscle spasm  01/31/2014   Nonallopathic lesion of cervical region 01/31/2014   Nonallopathic lesion of thoracic region 01/31/2014   Nonallopathic lesion of lumbosacral region 01/31/2014   Past Medical History:  Diagnosis Date   ADHD (attention deficit hyperactivity disorder)    Allergy    Anxiety    Depression    Headache    Hypertension     Family History  Problem Relation Age of Onset   Breast cancer Mother     Past Surgical History:  Procedure Laterality Date   EYE SURGERY     TOTAL HIP ARTHROPLASTY Left 09/16/2021   Procedure: LEFT TOTAL HIP ARTHROPLASTY ANTERIOR APPROACH;  Surgeon: Tarry Kos, MD;  Location: [facility];  Service: Orthopedics;  Laterality: Left;   Social History   Occupational History   Not on file  Tobacco Use   Smoking status: Never   Smokeless tobacco: Never  Vaping Use   Vaping Use: Never used  Substance and Sexual Activity   Alcohol use: Yes    Comment: occasionally   Drug use: Not on file   Sexual activity: Not on file

## 2021-12-27 DIAGNOSIS — Z713 Dietary counseling and surveillance: Secondary | ICD-10-CM | POA: Diagnosis not present

## 2022-01-08 DIAGNOSIS — Z85828 Personal history of other malignant neoplasm of skin: Secondary | ICD-10-CM | POA: Diagnosis not present

## 2022-01-08 DIAGNOSIS — D225 Melanocytic nevi of trunk: Secondary | ICD-10-CM | POA: Diagnosis not present

## 2022-01-08 DIAGNOSIS — L570 Actinic keratosis: Secondary | ICD-10-CM | POA: Diagnosis not present

## 2022-01-08 DIAGNOSIS — D485 Neoplasm of uncertain behavior of skin: Secondary | ICD-10-CM | POA: Diagnosis not present

## 2022-01-08 DIAGNOSIS — L821 Other seborrheic keratosis: Secondary | ICD-10-CM | POA: Diagnosis not present

## 2022-01-08 DIAGNOSIS — L814 Other melanin hyperpigmentation: Secondary | ICD-10-CM | POA: Diagnosis not present

## 2022-01-09 DIAGNOSIS — Z1231 Encounter for screening mammogram for malignant neoplasm of breast: Secondary | ICD-10-CM | POA: Diagnosis not present

## 2022-01-20 DIAGNOSIS — R5383 Other fatigue: Secondary | ICD-10-CM | POA: Diagnosis not present

## 2022-01-20 DIAGNOSIS — R42 Dizziness and giddiness: Secondary | ICD-10-CM | POA: Diagnosis not present

## 2022-01-21 DIAGNOSIS — F988 Other specified behavioral and emotional disorders with onset usually occurring in childhood and adolescence: Secondary | ICD-10-CM | POA: Diagnosis not present

## 2022-01-21 DIAGNOSIS — G4700 Insomnia, unspecified: Secondary | ICD-10-CM | POA: Diagnosis not present

## 2022-01-21 DIAGNOSIS — F339 Major depressive disorder, recurrent, unspecified: Secondary | ICD-10-CM | POA: Diagnosis not present

## 2022-01-21 DIAGNOSIS — F419 Anxiety disorder, unspecified: Secondary | ICD-10-CM | POA: Diagnosis not present

## 2022-02-12 DIAGNOSIS — R42 Dizziness and giddiness: Secondary | ICD-10-CM | POA: Diagnosis not present

## 2022-02-12 DIAGNOSIS — Z833 Family history of diabetes mellitus: Secondary | ICD-10-CM | POA: Diagnosis not present

## 2022-02-12 DIAGNOSIS — F900 Attention-deficit hyperactivity disorder, predominantly inattentive type: Secondary | ICD-10-CM | POA: Diagnosis not present

## 2022-02-12 DIAGNOSIS — I10 Essential (primary) hypertension: Secondary | ICD-10-CM | POA: Diagnosis not present

## 2022-02-18 ENCOUNTER — Other Ambulatory Visit: Payer: Self-pay | Admitting: Internal Medicine

## 2022-02-18 DIAGNOSIS — G4700 Insomnia, unspecified: Secondary | ICD-10-CM | POA: Diagnosis not present

## 2022-02-18 DIAGNOSIS — F988 Other specified behavioral and emotional disorders with onset usually occurring in childhood and adolescence: Secondary | ICD-10-CM | POA: Diagnosis not present

## 2022-02-18 DIAGNOSIS — F419 Anxiety disorder, unspecified: Secondary | ICD-10-CM | POA: Diagnosis not present

## 2022-02-18 DIAGNOSIS — F339 Major depressive disorder, recurrent, unspecified: Secondary | ICD-10-CM | POA: Diagnosis not present

## 2022-02-26 DIAGNOSIS — R42 Dizziness and giddiness: Secondary | ICD-10-CM | POA: Diagnosis not present

## 2022-02-26 DIAGNOSIS — Z0189 Encounter for other specified special examinations: Secondary | ICD-10-CM | POA: Diagnosis not present

## 2022-03-10 NOTE — Progress Notes (Signed)
? ?New Patient Note ? ?RE: Sheila DupesWendi Costales MRN: [id number] DOB: 01/29/1966 ?Date of Office Visit: 03/11/2022 ? ?Consult requested by: Ollen BowlPahwani, Rinka R, MD ?Primary care provider: Ollen BowlPahwani, Rinka R, MD ? ?Chief Complaint: Allergy Testing (Mold //Chocolate hurts her stomach ) and Allergic Reaction (I October her apartment was being cleaned out for mold.  A leak in the roof and her apartment is only 57 years old. Worried its still filtering through the air. The only cleared one bedroom. ( Stuffy, cough, and fatigue) ) ? ?History of Present Illness: ?I had the pleasure of seeing Sheila DupesWendi Espericueta for initial evaluation at the [facility] on 03/11/2022. She is a 56 y.o. female, who is referred here by Ollen BowlPahwani, Rinka R, MD for the evaluation of mold exposure. ? ?Patient was exposed to mold in September/October 2022 at her apartment due to a leak. Since then she noticed increased coughing, sneezing, nasal congestion and not feeling well when she is at her apartment.  ?Patient states that the mold was cleared up in one bedroom but patient is concerned about mold in her walls and windows.  ?Patient has been living in this apartment for 1 year.  ? ?Symptoms have been going on for 6 months or so. Anosmia: no. Headache: yes. She has used afrin with some improvement in symptoms. Sinus infections: 3. Previous work up includes: no. ?Previous ENT evaluation: not recently, no sinus surgery. ?Previous sinus imaging: no. ?History of nasal polyps: no. ?History of reflux: in the past. ? ?Assessment and Plan: ?Ursula AlertWendi is a 56 y.o. female with: ?Mold exposure ?Lived in current apartment for 1 year but in September/October 2022 had mold remediation done due to a leak. Concerned if mold exposure is causing her coughing, sneezing, nasal congestion and fatigue. Denies any prior allergic symptoms. No prior allergy testing. ?Today's skin prick testing showed: Negative to indoor/outdoor allergens including mold. ?Discussed with patient  that skin testing checks if she is allergic to mold. There is NO testing available if she was exposed to mold or if there's mold in her body.  ?Recommend remediation of the mold issue. ?See below for environmental control measures as below. ?Use Flonase (fluticasone) nasal spray 1 spray per nostril twice a day as needed for nasal congestion.  ?Nasal saline spray (i.e., Simply Saline) or nasal saline lavage (i.e., NeilMed) is recommended as needed and prior to medicated nasal sprays. ? ?Other adverse food reactions, not elsewhere classified, subsequent encounter ?Chocolate causes abdominal pain and headaches. Certain gluten containing foods cause abdominal pains. Wants to get tested for foods. ?Today's skin testing was negative to chocolate and wheat. ?No indication for any other food testing ad she does not have any symptom after eating other foods.  ?Chocolate is a known headache/migraine trigger. Continue to avoid if it causes issues. ?Wheat - limit intake it if causes GI issues.  ? ?Return if symptoms worsen or fail to improve. ? ?No orders of the defined types were placed in this encounter. ? ?Lab Orders  ?No laboratory test(s) ordered today  ? ? ?Other allergy screening: ?Asthma: no ?Food allergy: no ?Chocolate causes some abdominal pains and headaches. ?Certain breads cause abdominal pains. ? ?Past work up includes: none. ?Dietary History: patient has been eating other foods including milk, eggs, peanut, treenuts, sesame, shellfish, fish, soy, wheat, meats, fruits and vegetables. ? ?Medication allergy: yes ?Hymenoptera allergy: no ?Urticaria: no ?Eczema:no ?History of recurrent infections suggestive of immunodeficency: no ? ?Diagnostics: ?Skin Testing: Environmental allergy panel and select  foods. ?Negative to indoor/outdoor allergens including mold. ?Negative to chocolate and wheat.  ?Results discussed with patient/family. ? Airborne Adult Perc - 03/11/22 1433   ? ? Time Antigen Placed 1433   ? Allergen  Manufacturer Waynette Buttery   ? Location Back   ? Number of Test 59   ? 1. Control-Buffer 50% Glycerol Negative   ? 2. Control-Histamine 1 mg/ml 3+   ? 3. Albumin saline Negative   ? 4. Bahia Negative   ? 5. French Southern Territ[place] Negative   ? 6. Johnson Negative   ? 7. [place] Blue Negative   ? 8. Meadow Fescue Negative   ? 9. Perennial Rye Negative   ? 10. Sweet Vernal Negative   ? 11. Timothy Negative   ? 12. Cocklebur Negative   ? 13. Burweed Marshelder Negative   ? 14. Ragweed, short Negative   ? 15. Ragweed, Giant Negative   ? 16. Plantain,  English Negative   ? 17. Lamb's Quarters Negative   ? 18. Sheep Sorrell Negative   ? 19. Rough Pigweed Negative   ? 20. Marsh Elder, Rough Negative   ? 21. Mugwort, Common Negative   ? 22. Ash mix Negative   ? 23. Charletta Cousin mix Negative   ? 24. Beech American Negative   ? 25. Box, Elder Negative   ? 26. Cedar, red Negative   ? 27. Cottonwood, Guinea[place]issau Negative   ? 28. Elm mix Negative   ? 29. Hickory Negative   ? 30. Maple mix Negative   ? 31. Oak, [place] mix Negative   ? 32. Pecan Pollen Negative   ? 33. Pine mix Negative   ? 34. Sycamore Eastern Negative   ? 35. Walnut, Black Pollen Negative   ? 36. Alternaria alternata Negative   ? 37. Cladosporium Herbarum Negative   ? 38. Aspergillus mix Negative   ? 39. Penicillium mix Negative   ? 40. Bipolaris sorokiniana (Helminthosporium) Negative   ? 41. Drechslera spicifera (Curvularia) Negative   ? 42. Mucor plumbeus Negative   ? 43. Fusarium moniliforme Negative   ? 44. Aureobasidium pullulans (pullulara) Negative   ? 45. Rhizopus oryzae Negative   ? 46. Botrytis cinera Negative   ? 47. Epicoccum nigrum Negative   ? 48. Phoma betae Negative   ? 49. Candida Albicans Negative   ? 50. Trichophyton mentagrophytes Negative   ? 51. Mite, D Farinae  5,000 AU/ml Negative   ? 52. Mite, D Pteronyssinus  5,000 AU/ml Negative   ? 53. Cat Hair 10,000 BAU/ml Negative   ? 54.  Dog Epithelia Negative   ? 55. Mixed Feathers Negative   ? 56. Horse Epithelia Negative   ?  57. Cockroach, [place] Negative   ? 58. Mouse Negative   ? 59. Tobacco Leaf Negative   ? ?  ?  ? ?  ? ? Food Adult Perc - 03/11/22 1400   ? ? Time Antigen Placed 1433   ? Allergen Manufacturer Waynette Buttery   ? Location Back   ? Number of allergen test 2   ? 3. Wheat Negative   ? 64. Chocolate/Cacao bean Negative   ? ?  ?  ? ?  ? ? ?Past Medical History: ?Patient Active Problem List  ? Diagnosis Date Noted  ? Chronic rhinitis 03/11/2022  ? Mold exposure 03/11/2022  ? Other adverse food reactions, not elsewhere classified, subsequent encounter 03/11/2022  ? Primary osteoarthritis of left hip 09/16/2021  ? Status post total replacement of left hip 09/16/2021  ?  Nonallopathic lesion of sacral region 07/27/2018  ? Nonallopathic lesion of rib cage 07/27/2018  ? Patellofemoral syndrome of both knees 02/10/2017  ? Right lumbar radiculopathy 03/06/2015  ? SI (sacroiliac) joint dysfunction 08/25/2014  ? Abdominal pain, chronic, right lower quadrant 04/04/2014  ? Hip flexor tightness 02/22/2014  ? Neck muscle spasm 01/31/2014  ? Nonallopathic lesion of cervical region 01/31/2014  ? Nonallopathic lesion of thoracic region 01/31/2014  ? Nonallopathic lesion of lumbosacral region 01/31/2014  ? ?Past Medical History:  ?Diagnosis Date  ? ADHD (attention deficit hyperactivity disorder)   ? Allergy   ? Anxiety   ? Depression   ? Headache   ? Hypertension   ? Recurrent upper respiratory infection (URI)   ? ?Past Surgical History: ?Past Surgical History:  ?Procedure Laterality Date  ? EYE SURGERY    ? TOTAL HIP ARTHROPLASTY Left 09/16/2021  ? Procedure: LEFT TOTAL HIP ARTHROPLASTY ANTERIOR APPROACH;  Surgeon: Tarry Kos, MD;  Location: [facility] OR;  Service: Orthopedics;  Laterality: Left;  ? ?Medication List:  ?Current Outpatient Medications  ?Medication Sig Dispense Refill  ? amphetamine-dextroamphetamine (ADDERALL XR) 15 MG 24 hr capsule Take 15 mg by mouth daily.    ? EMGALITY 120 MG/ML SOAJ Inject 120 mg into the skin every 30 (thirty) days.     ? estradiol (ESTRACE) 1 MG tablet Take 1 mg by mouth daily.    ? Magnesium 400 MG TABS Take 400 mg by mouth daily.    ? Multiple Vitamins-Minerals (ZINC PO) Take 60 mg by mouth daily.    ? ondansetron (

## 2022-03-11 ENCOUNTER — Ambulatory Visit: Payer: BC Managed Care – PPO | Admitting: Allergy

## 2022-03-11 ENCOUNTER — Encounter: Payer: Self-pay | Admitting: Allergy

## 2022-03-11 ENCOUNTER — Other Ambulatory Visit: Payer: Self-pay

## 2022-03-11 VITALS — BP 124/80 | HR 84 | Temp 98.2°F | Resp 18 | Ht 60.0 in | Wt 126.4 lb

## 2022-03-11 DIAGNOSIS — J310 Chronic rhinitis: Secondary | ICD-10-CM

## 2022-03-11 DIAGNOSIS — Z77120 Contact with and (suspected) exposure to mold (toxic): Secondary | ICD-10-CM

## 2022-03-11 DIAGNOSIS — T781XXD Other adverse food reactions, not elsewhere classified, subsequent encounter: Secondary | ICD-10-CM | POA: Insufficient documentation

## 2022-03-11 NOTE — Assessment & Plan Note (Signed)
Lived in current apartment for 1 year but in September/October 2022 had mold remediation done due to a leak. Concerned if mold exposure is causing her coughing, sneezing, nasal congestion and fatigue. Denies any prior allergic symptoms. No prior allergy testing. ?? Today's skin prick testing showed: Negative to indoor/outdoor allergens including mold. ?? Discussed with patient that skin testing checks if she is allergic to mold. There is NO testing available if she was exposed to mold or if there's mold in her body.  ?? Recommend remediation of the mold issue. ?? See below for environmental control measures as below. ?? Use Flonase (fluticasone) nasal spray 1 spray per nostril twice a day as needed for nasal congestion.  ?? Nasal saline spray (i.e., Simply Saline) or nasal saline lavage (i.e., NeilMed) is recommended as needed and prior to medicated nasal sprays. ?

## 2022-03-11 NOTE — Assessment & Plan Note (Signed)
Chocolate causes abdominal pain and headaches. Certain gluten containing foods cause abdominal pains. Wants to get tested for foods. ?? Today's skin testing was negative to chocolate and wheat. ?? No indication for any other food testing ad she does not have any symptom after eating other foods.  ?? Chocolate is a known headache/migraine trigger. Continue to avoid if it causes issues. ?? Wheat - limit intake it if causes GI issues.  ?

## 2022-03-11 NOTE — Patient Instructions (Addendum)
Today's skin testing showed: ?Negative to indoor/outdoor allergens including mold. ?Negative to chocolate and wheat.  ? ?Results given. ? ?Mold: ?Recommend remediation of the mold issue. ?See below for environmental control measures as below. ?Use Flonase (fluticasone) nasal spray 1 spray per nostril twice a day as needed for nasal congestion.  ?Nasal saline spray (i.e., Simply Saline) or nasal saline lavage (i.e., NeilMed) is recommended as needed and prior to medicated nasal sprays. ? ?Food: ?Chocolate is a known headache/migraine trigger. Continue to avoid if it causes issues. ?Wheat - limit intake it if causes GI issues.  ? ?Follow up if needed.   ? ?Mold Control ?Mold and fungi can grow on a variety of surfaces provided certain temperature and moisture conditions exist.  ?Outdoor molds grow on plants, decaying vegetation and soil. The major outdoor mold, Alternaria and Cladosporium, are found in very high numbers during hot and dry conditions. Generally, a late summer - fall peak is seen for common outdoor fungal spores. Rain will temporarily lower outdoor mold spore count, but counts rise rapidly when the rainy period ends. ?The most important indoor molds are Aspergillus and Penicillium. Dark, humid and poorly ventilated basements are ideal sites for mold growth. The next most common sites of mold growth are the bathroom and the kitchen. ?Outdoor (Seasonal) Mold Control ?Use air conditioning and keep windows closed. ?Avoid exposure to decaying vegetation. ?Avoid leaf raking. ?Avoid grain handling. ?Consider wearing a face mask if working in moldy areas.  ?Indoor (Perennial) Mold Control  ?Maintain humidity below 50%. ?Get rid of mold growth on hard surfaces with water, detergent and, if necessary, 5% bleach (do not mix with other cleaners). Then dry the area completely. If mold covers an area more than 10 square feet, consider hiring an indoor environmental professional. ?For clothing, washing with soap and  water is best. If moldy items cannot be cleaned and dried, throw them away. ?Remove sources e.g. contaminated carpets. ?Repair and seal leaking roofs or pipes. Using dehumidifiers in damp basements may be helpful, but empty the water and clean units regularly to prevent mildew from forming. All rooms, especially basements, bathrooms and kitchens, require ventilation and cleaning to deter mold and mildew growth. Avoid carpeting on concrete or damp floors, and storing items in damp areas. ? ?

## 2022-03-18 DIAGNOSIS — F419 Anxiety disorder, unspecified: Secondary | ICD-10-CM | POA: Diagnosis not present

## 2022-03-18 DIAGNOSIS — F339 Major depressive disorder, recurrent, unspecified: Secondary | ICD-10-CM | POA: Diagnosis not present

## 2022-03-18 DIAGNOSIS — F988 Other specified behavioral and emotional disorders with onset usually occurring in childhood and adolescence: Secondary | ICD-10-CM | POA: Diagnosis not present

## 2022-03-18 DIAGNOSIS — G4700 Insomnia, unspecified: Secondary | ICD-10-CM | POA: Diagnosis not present

## 2022-03-20 DIAGNOSIS — M25511 Pain in right shoulder: Secondary | ICD-10-CM | POA: Diagnosis not present

## 2022-03-24 ENCOUNTER — Telehealth: Payer: Self-pay | Admitting: Orthopaedic Surgery

## 2022-03-24 NOTE — Telephone Encounter (Signed)
Pt called and states she is having a lot of pain in her hip that was replaced in sept of 2022. She would like to know if she can come in this week  ? ?CB  ?

## 2022-03-25 NOTE — Telephone Encounter (Signed)
Sure she can come in Thursday morning.

## 2022-03-26 ENCOUNTER — Ambulatory Visit: Payer: BC Managed Care – PPO | Admitting: Orthopaedic Surgery

## 2022-03-26 ENCOUNTER — Encounter: Payer: Self-pay | Admitting: Orthopaedic Surgery

## 2022-03-26 ENCOUNTER — Ambulatory Visit (INDEPENDENT_AMBULATORY_CARE_PROVIDER_SITE_OTHER): Payer: BC Managed Care – PPO

## 2022-03-26 DIAGNOSIS — Z96642 Presence of left artificial hip joint: Secondary | ICD-10-CM

## 2022-03-26 NOTE — Progress Notes (Signed)
? ?Office Visit Note ?  ?Patient: Sheila Salazar           ?Date of Birth: 19-Oct-1966           ?MRN: [id number] ?Visit Date: 03/26/2022 ?             ?Requested by: Ollen Bowl, MD ?[address] ?[place],  [place] [address] ?PCP: Ollen Bowl, MD ? ? ?Assessment & Plan: ?Visit Diagnoses:  ?1. Status post total replacement of left hip   ? ? ?Plan: Impression is overuse of TFL and possibly other hip flexors.  Recommend relative rest.  Anti-inflammatories.  Heat and ice.  Recommend deep massages and demonstrated stretches for her to do at home.  Follow-up as scheduled for 1 year visit with standing AP pelvis x-rays. ? ?Follow-Up Instructions: Return in about 5 months (around 08/26/2022).  ? ?Orders:  ?Orders Placed This Encounter  ?Procedures  ? XR HIP UNILAT W OR W/O PELVIS 2-3 VIEWS LEFT  ? ?No orders of the defined types were placed in this encounter. ? ? ? ? Procedures: ?No procedures performed ? ? ?Clinical Data: ?No additional findings. ? ? ?Subjective: ?Chief Complaint  ?Patient presents with  ? Left Hip - Pain  ? ? ?HPI ? ?Sheila Salazar comes in today for evaluation of left hip pain for about 2 months.  Denies any changes in activities or injuries.  Denies any constitutional symptoms.  Pain is worse when she gets up from a seated position.  The pain is localized to the TFL region.  Denies any groin or back or radicular symptoms. ? ?Review of Systems ? ? ?Objective: ?Vital Signs: There were no vitals taken for this visit. ? ?Physical Exam ? ?Ortho Exam ? ?Examination of left hip shows a fully healed surgical scar.  No evidence of infection.  She has full range of motion without pain.  She is slightly sore to deep palpation to the TFL muscle.  She has reproducible discomfort at the TFL with resisted hip flexion.  No sciatic tension signs. ? ?Specialty Comments:  ?No specialty comments available. ? ?Imaging: ?XR HIP UNILAT W OR W/O PELVIS 2-3 VIEWS LEFT ? ?Result Date: 03/26/2022 ?Stable total hip  replacement without complication  ? ? ?PMFS History: ?Patient Active Problem List  ? Diagnosis Date Noted  ? Chronic rhinitis 03/11/2022  ? Mold exposure 03/11/2022  ? Other adverse food reactions, not elsewhere classified, subsequent encounter 03/11/2022  ? Primary osteoarthritis of left hip 09/16/2021  ? Status post total replacement of left hip 09/16/2021  ? Nonallopathic lesion of sacral region 07/27/2018  ? Nonallopathic lesion of rib cage 07/27/2018  ? Patellofemoral syndrome of both knees 02/10/2017  ? Right lumbar radiculopathy 03/06/2015  ? SI (sacroiliac) joint dysfunction 08/25/2014  ? Abdominal pain, chronic, right lower quadrant 04/04/2014  ? Hip flexor tightness 02/22/2014  ? Neck muscle spasm 01/31/2014  ? Nonallopathic lesion of cervical region 01/31/2014  ? Nonallopathic lesion of thoracic region 01/31/2014  ? Nonallopathic lesion of lumbosacral region 01/31/2014  ? ?Past Medical History:  ?Diagnosis Date  ? ADHD (attention deficit hyperactivity disorder)   ? Allergy   ? Anxiety   ? Depression   ? Headache   ? Hypertension   ? Recurrent upper respiratory infection (URI)   ?  ?Family History  ?Problem Relation Age of Onset  ? Breast cancer Mother   ?  ?Past Surgical History:  ?Procedure Laterality Date  ? EYE SURGERY    ? TOTAL  HIP ARTHROPLASTY Left 09/16/2021  ? Procedure: LEFT TOTAL HIP ARTHROPLASTY ANTERIOR APPROACH;  Surgeon: Tarry Kos, MD;  Location: [facility] OR;  Service: Orthopedics;  Laterality: Left;  ? ?Social History  ? ?Occupational History  ? Not on file  ?Tobacco Use  ? Smoking status: Never  ? Smokeless tobacco: Never  ?Vaping Use  ? Vaping Use: Never used  ?Substance and Sexual Activity  ? Alcohol use: Yes  ?  Comment: occasionally  ? Drug use: Not on file  ? Sexual activity: Not on file  ? ? ? ? ? ? ?

## 2022-03-26 NOTE — Telephone Encounter (Signed)
Already scheduled

## 2022-03-31 NOTE — Progress Notes (Signed)
? ?Referring:  ?Pahwani, Kasandra Knudsen, MD ?[address] ?[place],  [place] [address] ? ?PCP: ?Ollen Bowl, MD ? ?Neurology was asked to evaluate Sheila Salazar, a 56 year old female for a chief complaint of headaches.  Our recommendations of care will be communicated by shared medical record.   ? ?CC:  headaches ? ?History provided from self ? ?HPI:  ?Medical co-morbidities: anxiety, HTN ? ?The patient presents for evaluation of headaches. She has had migraines since she was 56 years old. They are described as bitemporal throbbing with associated photophobia, phonophobia, and nausea. They are occasionally associated with a visual aura. Migraines can last for several days at a time. She is currently taking Emgality for headache prevention, but continues to get 14 headaches per month. She takes Tylenol and BC powder up to 4 times per week. This helps reduce her pain but causes stomach upset. States she tried triptans in the past which helped, but she cannot remember which ones she tried. ? ?Headache History: ?Onset: 56 years old ?Triggers: stress, heat ?Aura: visual aura sometimes ?Location: bilateral temples ?Quality/Description: throbbing ?Associated Symptoms: ? Photophobia: yes ? Phonophobia: yes ? Nausea: yes ?Vomiting: yes ?Worse with activity?: yes ?Duration of headaches: several days ? ?Headache days per month: 14 ?Headache free days per month: 16 ? ?Current Treatment: ?Abortive ?Tylenol ?BC Powder ? ?Preventative ?Emgality 120 mg monthly ? ?Prior Therapies                                 ?Effexor 225 mg daily ?Emgality ?Ajovy - lack of efficacy ?Aimovig - lack of efficacy ?Lisinopril - side effects ?Topamax - side effects ?Gabapentin 100 mg TID ?Amlodipine 10 mg daily ?Botox - lack of efficacy ?Robaxin 500 mg PRN ? ? ?LABS: ?CBC ?   ?Component Value Date/Time  ? WBC 11.0 (H) 09/17/2021 0706  ? RBC 3.46 (L) 09/17/2021 0706  ? HGB 10.0 (L) 09/17/2021 0706  ? HCT 30.8 (L) 09/17/2021 0706  ? PLT 351  09/17/2021 0706  ? MCV 89.0 09/17/2021 0706  ? MCH 28.9 09/17/2021 0706  ? MCHC 32.5 09/17/2021 0706  ? RDW 13.3 09/17/2021 0706  ? LYMPHSABS 1.0 09/12/2021 0829  ? MONOABS 0.5 09/12/2021 0829  ? EOSABS 0.1 09/12/2021 0829  ? BASOSABS 0.0 09/12/2021 0829  ? ? ?  Latest Ref Rng & Units 09/17/2021  ?  7:06 AM 09/12/2021  ?  8:29 AM 02/03/2018  ?  3:37 PM  ?CMP  ?Glucose 70 - 99 mg/dL 563   86   89    ?BUN 6 - 20 mg/dL 10   18   21     ?Creatinine 0.44 - 1.00 mg/dL   8.93   7.34    ?Sodium 135 - 145 mmol/L 136   139   138    ?Potassium 3.5 - 5.1 mmol/L 4.1   3.3   3.9    ?Chloride 98 - 111 mmol/L 107   107   102    ?CO2 22 - 32 mmol/L 24   24   27     ?Calcium 8.9 - 10.3 mg/dL 8.5   9.5   9.7    ? 9.5    ?Total Protein 6.5 - 8.1 g/dL  6.9   7.4    ?Total Bilirubin 0.3 - 1.2 mg/dL  0.6   0.4    ?Alkaline Phos 38 - 126 U/L  94  50    ?AST 15 - 41 U/L  23   16    ?ALT 0 - 44 U/L  34   12    ? ? ? ?IMAGING:  ?none ? ? ?Current Outpatient Medications on File Prior to Visit  ?Medication Sig Dispense Refill  ? amphetamine-dextroamphetamine (ADDERALL XR) 15 MG 24 hr capsule Take 15 mg by mouth daily.    ? EMGALITY 120 MG/ML SOAJ Inject 120 mg into the skin every 30 (thirty) days.    ? estradiol (ESTRACE) 1 MG tablet Take 1 mg by mouth daily.    ? Magnesium 400 MG TABS Take 400 mg by mouth daily.    ? Multiple Vitamins-Minerals (ZINC PO) Take 60 mg by mouth daily.    ? venlafaxine XR (EFFEXOR-XR) 150 MG 24 hr capsule Take 150 mg by mouth daily. Take with 75 mg to equal 225 mg daily    ? VRAYLAR 1.5 MG capsule Take 1.5 mg by mouth daily.    ? ?No current facility-administered medications on file prior to visit.  ? ? ? ?Allergies: ?Allergies  ?Allergen Reactions  ? Erythromycin   ?  Stomach cramps   ? Lisinopril Other (See Comments)  ? Sudafed [Pseudoephedrine] Other (See Comments)  ? Topiramate Other (See Comments)  ? Sulfa Antibiotics Rash  ? ? ?Family History: ?Migraine or other headaches in the family:  daughter ?Aneurysms  in a first degree relative:  no ?Brain tumors in the family:  no ?Other neurological illness in the family:   no ? ?Past Medical History: ?Past Medical History:  ?Diagnosis Date  ? ADHD (attention deficit hyperactivity disorder)   ? Allergy   ? Anxiety   ? Depression   ? Headache   ? Hypertension   ? Insomnia   ? Migraine   ? Recurrent upper respiratory infection (URI)   ? ? ?Past Surgical History ?Past Surgical History:  ?Procedure Laterality Date  ? EYE SURGERY    ? TOTAL HIP ARTHROPLASTY Left 09/16/2021  ? Procedure: LEFT TOTAL HIP ARTHROPLASTY ANTERIOR APPROACH;  Surgeon: Tarry KosXu, Naiping M, MD;  Location: [facility] OR;  Service: Orthopedics;  Laterality: Left;  ? ? ?Social History: ?Social History  ? ?Tobacco Use  ? Smoking status: Never  ? Smokeless tobacco: Never  ?Vaping Use  ? Vaping Use: Never used  ?Substance Use Topics  ? Alcohol use: Yes  ?  Comment: occasionally  ? Drug use: Never  ? ? ? ?ROS: ?Negative for fevers, chills. Positive for headaches. All other systems reviewed and negative unless stated otherwise in HPI. ? ? ?Physical Exam:  ? ?Vital Signs: ?BP 133/80   Pulse 71   Ht 5' (1.524 m)   Wt 130 lb (59 kg)   BMI 25.39 kg/m?  ?GENERAL: well appearing,in no acute distress,alert ?SKIN:  Color, texture, turgor normal. No rashes or lesions ?HEAD:  Normocephalic/atraumatic. ?CV:  RRR ?RESP: Normal respiratory effort ?MSK: no tenderness to palpation over occiput, neck, or shoulders ? ?NEUROLOGICAL: ?Mental Status: Alert, oriented to person, place and time,Follows commands ?Cranial Nerves: PERRL, visual fields intact to confrontation, extraocular movements intact, diminished sensation left V1, no facial droop or ptosis, hearing grossly intact, no dysarthria ?Motor: muscle strength 5/5 both upper and lower extremities,no drift, normal tone ?Reflexes: 2+ throughout ?Sensation: intact to light touch all 4 extremities ?Coordination: Finger-to- nose-finger intact bilaterally ?Gait: normal-based ? ? ?IMPRESSION: ?56  year old female with a history of anxiety, HTN who presents for evaluation of migraines. Will order MRI  brain as exam today reveals decreased sensation over right V1. She has failed multiple medications and has had the most success with Emgality, but continues to have ~14 headache days per month. Discussed prevention options and she would like to try Vyepti. Will start Maxalt for migraine rescue. Counseled her on limiting rescue medication use to 2 days per week to avoid rebound headaches. ? ?PLAN: ?-MRI brain with contrast ?-Prevention: Stop Emgality. Start Vyepti 100 mg q3 months ?-Rescue: Start Maxalt 10 mg PRN ?-Counseled on limiting rescue medication use to 2 days per week to avoid rebound headaches ? ?I spent a total of 26 minutes chart reviewing and counseling the patient. Headache education was done. Discussed treatment options including preventive and acute medications. Discussed medication overuse headache and to limit use of acute treatments to no more than 2 days/week or 10 days/month. Discussed medication side effects, adverse reactions and drug interactions. Written educational materials and patient instructions outlining all of the above were given. ? ?Follow-up: 4 months ? ? ?Ocie Doyne, MD ?04/01/2022   ?9:01 AM ? ? ?

## 2022-04-01 ENCOUNTER — Encounter: Payer: Self-pay | Admitting: Psychiatry

## 2022-04-01 ENCOUNTER — Ambulatory Visit: Payer: BC Managed Care – PPO | Admitting: Psychiatry

## 2022-04-01 VITALS — BP 133/80 | HR 71 | Ht 60.0 in | Wt 130.0 lb

## 2022-04-01 DIAGNOSIS — R200 Anesthesia of skin: Secondary | ICD-10-CM | POA: Diagnosis not present

## 2022-04-01 DIAGNOSIS — G43119 Migraine with aura, intractable, without status migrainosus: Secondary | ICD-10-CM

## 2022-04-01 MED ORDER — RIZATRIPTAN BENZOATE 10 MG PO TABS: 10.0000 mg | ORAL_TABLET | ORAL | 3 refills | Status: AC | PRN

## 2022-04-01 NOTE — Patient Instructions (Addendum)
Start Vyepti infusion every 3 months ?Start rizatriptan as needed for migraines. Take at the onset of migraine. If headache recurs or does not fully resolve, you may take a second dose after 2 hours. Please avoid taking more than 2 days per week or 10 days per month. ?MRI brain ?

## 2022-04-02 ENCOUNTER — Telehealth: Payer: Self-pay

## 2022-04-02 ENCOUNTER — Ambulatory Visit: Payer: BC Managed Care – PPO | Admitting: Physician Assistant

## 2022-04-02 NOTE — Telephone Encounter (Signed)
PA for Vyepti has been via cmm.  ? (Key: [id number]) ? ?Your information has been submitted to [place]. [employer] will review the request and notify you of the determination decision directly, typically within 72 hours of receiving all information. ? ?You will also receive your request decision electronically. To check for an update later, open this request again from your dashboard. ? ?If [employer] has not responded within the specified timeframe or if you have any questions about your PA submission, contact [place] directly at [phone]. ?

## 2022-04-03 ENCOUNTER — Ambulatory Visit: Payer: BC Managed Care – PPO | Admitting: Psychiatry

## 2022-04-07 ENCOUNTER — Telehealth: Payer: Self-pay | Admitting: Psychiatry

## 2022-04-07 NOTE — Telephone Encounter (Signed)
BCBS pending uploaded notes on the portal 

## 2022-04-09 DIAGNOSIS — M25511 Pain in right shoulder: Secondary | ICD-10-CM | POA: Diagnosis not present

## 2022-04-09 DIAGNOSIS — M75100 Unspecified rotator cuff tear or rupture of unspecified shoulder, not specified as traumatic: Secondary | ICD-10-CM | POA: Diagnosis not present

## 2022-04-09 DIAGNOSIS — M19019 Primary osteoarthritis, unspecified shoulder: Secondary | ICD-10-CM | POA: Diagnosis not present

## 2022-04-09 DIAGNOSIS — M7520 Bicipital tendinitis, unspecified shoulder: Secondary | ICD-10-CM | POA: Diagnosis not present

## 2022-04-09 NOTE — Telephone Encounter (Addendum)
Received fax from [facility] requesting clinical notes for this request. ? ?I have sent to fax # [phone], confirmation has been received.  ?

## 2022-04-14 NOTE — Telephone Encounter (Signed)
LVM for pt to call back to schedule  ?Yetta Numbers: [id number] (exp. 04/07/22 to 05/06/22)  ?

## 2022-04-17 NOTE — Telephone Encounter (Signed)
X2 lvm for pt to call back.  ?

## 2022-04-24 NOTE — Telephone Encounter (Signed)
spoke to the patient due to the cost she is going to reach out to her insurance company and find somewhere else that is chearper for her  ?

## 2022-05-14 DIAGNOSIS — F900 Attention-deficit hyperactivity disorder, predominantly inattentive type: Secondary | ICD-10-CM | POA: Diagnosis not present

## 2022-05-14 DIAGNOSIS — I10 Essential (primary) hypertension: Secondary | ICD-10-CM | POA: Diagnosis not present

## 2022-05-14 DIAGNOSIS — G43909 Migraine, unspecified, not intractable, without status migrainosus: Secondary | ICD-10-CM | POA: Diagnosis not present

## 2022-05-14 DIAGNOSIS — F39 Unspecified mood [affective] disorder: Secondary | ICD-10-CM | POA: Diagnosis not present

## 2022-05-20 DIAGNOSIS — F339 Major depressive disorder, recurrent, unspecified: Secondary | ICD-10-CM | POA: Diagnosis not present

## 2022-05-20 DIAGNOSIS — G4700 Insomnia, unspecified: Secondary | ICD-10-CM | POA: Diagnosis not present

## 2022-05-20 DIAGNOSIS — F419 Anxiety disorder, unspecified: Secondary | ICD-10-CM | POA: Diagnosis not present

## 2022-05-20 DIAGNOSIS — F988 Other specified behavioral and emotional disorders with onset usually occurring in childhood and adolescence: Secondary | ICD-10-CM | POA: Diagnosis not present

## 2022-06-17 DIAGNOSIS — F339 Major depressive disorder, recurrent, unspecified: Secondary | ICD-10-CM | POA: Diagnosis not present

## 2022-06-17 DIAGNOSIS — G4700 Insomnia, unspecified: Secondary | ICD-10-CM | POA: Diagnosis not present

## 2022-06-17 DIAGNOSIS — F988 Other specified behavioral and emotional disorders with onset usually occurring in childhood and adolescence: Secondary | ICD-10-CM | POA: Diagnosis not present

## 2022-06-17 DIAGNOSIS — F419 Anxiety disorder, unspecified: Secondary | ICD-10-CM | POA: Diagnosis not present

## 2022-08-19 DIAGNOSIS — F339 Major depressive disorder, recurrent, unspecified: Secondary | ICD-10-CM | POA: Diagnosis not present

## 2022-08-20 DIAGNOSIS — R7303 Prediabetes: Secondary | ICD-10-CM | POA: Diagnosis not present

## 2022-08-20 DIAGNOSIS — G43909 Migraine, unspecified, not intractable, without status migrainosus: Secondary | ICD-10-CM | POA: Diagnosis not present

## 2022-08-20 DIAGNOSIS — M8580 Other specified disorders of bone density and structure, unspecified site: Secondary | ICD-10-CM | POA: Diagnosis not present

## 2022-08-20 DIAGNOSIS — Z8632 Personal history of gestational diabetes: Secondary | ICD-10-CM | POA: Diagnosis not present

## 2022-08-20 DIAGNOSIS — Z13220 Encounter for screening for lipoid disorders: Secondary | ICD-10-CM | POA: Diagnosis not present

## 2022-08-20 DIAGNOSIS — Z780 Asymptomatic menopausal state: Secondary | ICD-10-CM | POA: Diagnosis not present

## 2022-08-20 DIAGNOSIS — Z0000 Encounter for general adult medical examination without abnormal findings: Secondary | ICD-10-CM | POA: Diagnosis not present

## 2022-08-26 ENCOUNTER — Ambulatory Visit: Payer: BC Managed Care – PPO | Admitting: Orthopaedic Surgery

## 2022-08-27 ENCOUNTER — Ambulatory Visit: Payer: BC Managed Care – PPO | Admitting: Orthopaedic Surgery

## 2022-08-27 ENCOUNTER — Ambulatory Visit (INDEPENDENT_AMBULATORY_CARE_PROVIDER_SITE_OTHER): Payer: BC Managed Care – PPO

## 2022-08-27 DIAGNOSIS — Z96642 Presence of left artificial hip joint: Secondary | ICD-10-CM

## 2022-08-27 NOTE — Progress Notes (Signed)
   Post-Op Visit Note   Patient: Sheila Salazar           Date of Birth: 1966/08/30           MRN: [id number] Visit Date: 08/27/2022 PCP: Ollen Bowl, MD   Assessment & Plan:  Chief Complaint:  Chief Complaint  Patient presents with   Right Shoulder - Follow-up   Visit Diagnoses:  1. Status post total replacement of left hip     Plan: Patient is a pleasant 56 year old female who comes in today approximately 1 year status post left total hip replacement 09/16/2021.  She has been doing notably well.  She notes slight stiffness and discomfort with hip flexion but tells me she has not been working on exercises as she feels like she should.  Overall, she does feel good.  Examination of her left hip reveals slight discomfort with hip flexion.  Painless logroll.  She is neurovascular intact distally.  At this point, she will restart her home exercises.  She will start to walk as well which I think will help.  Dental prophylaxis reinforced for another year.  Follow-up in 1 year for repeat evaluation and AP pelvis x-rays.  She will call us with any concerns or questions in the meantime.  Follow-Up Instructions: Return in about 1 year (around 08/28/2023).   Orders:  Orders Placed This Encounter  Procedures   XR Pelvis 1-2 Views   No orders of the defined types were placed in this encounter.   Imaging: No results found.  PMFS History: Patient Active Problem List   Diagnosis Date Noted   Chronic rhinitis 03/11/2022   Mold exposure 03/11/2022   Other adverse food reactions, not elsewhere classified, subsequent encounter 03/11/2022   Primary osteoarthritis of left hip 09/16/2021   Status post total replacement of left hip 09/16/2021   Nonallopathic lesion of sacral region 07/27/2018   Nonallopathic lesion of rib cage 07/27/2018   Patellofemoral syndrome of both knees 02/10/2017   Right lumbar radiculopathy 03/06/2015   SI (sacroiliac) joint dysfunction 08/25/2014   Abdominal pain,  chronic, right lower quadrant 04/04/2014   Hip flexor tightness 02/22/2014   Neck muscle spasm 01/31/2014   Nonallopathic lesion of cervical region 01/31/2014   Nonallopathic lesion of thoracic region 01/31/2014   Nonallopathic lesion of lumbosacral region 01/31/2014   Past Medical History:  Diagnosis Date   ADHD (attention deficit hyperactivity disorder)    Allergy    Anxiety    Depression    Headache    Hypertension    Insomnia    Migraine    Recurrent upper respiratory infection (URI)     Family History  Problem Relation Age of Onset   Breast cancer Mother    ALS Mother    Hypertension Father    Hypertension Brother     Past Surgical History:  Procedure Laterality Date   EYE SURGERY     TOTAL HIP ARTHROPLASTY Left 09/16/2021   Procedure: LEFT TOTAL HIP ARTHROPLASTY ANTERIOR APPROACH;  Surgeon: Tarry Kos, MD;  Location: [facility] OR;  Service: Orthopedics;  Laterality: Left;   Social History   Occupational History    Comment: real estate  Tobacco Use   Smoking status: Never   Smokeless tobacco: Never  Vaping Use   Vaping Use: Never used  Substance and Sexual Activity   Alcohol use: Yes    Comment: occasionally   Drug use: Never   Sexual activity: Not on file

## 2022-09-08 NOTE — Progress Notes (Deleted)
   CC:  headaches  Follow-up Visit  Last visit: 04/01/22  Brief HPI: 56 year old female with a history of anxiety and HTN who follows in clinic for chronic migraines.  At her last visit she was started on Vyepti for migraine prevention and Maxalt for rescue. Brain MRI was ordered.  Interval History: Brain MRI was not done***   Headache days per month: *** Headache free days per month: *** Headache severity: ***  Current Headache Regimen: Preventative: *** Abortive: ***  # of doses of abortive medications per month: ***  Prior Therapies                                  Effexor 225 mg daily Emgality Ajovy - lack of efficacy Aimovig - lack of efficacy Lisinopril - side effects Topamax - side effects Gabapentin 100 mg TID Amlodipine 10 mg daily Botox - lack of efficacy Robaxin 500 mg PRN  Physical Exam:   Vital Signs: There were no vitals taken for this visit. GENERAL:  well appearing, in no acute distress, alert  SKIN:  Color, texture, turgor normal. No rashes or lesions HEAD:  Normocephalic/atraumatic. RESP: normal respiratory effort MSK:  No gross joint deformities.   NEUROLOGICAL: Mental Status: Alert, oriented to person, place and time, Follows commands, and Speech fluent and appropriate. Cranial Nerves: PERRL, face symmetric, no dysarthria, hearing grossly intact Motor: moves all extremities equally Gait: normal-based.  IMPRESSION: ***  PLAN: *** Consider beta blocker, amitriptyline, zonisamide, qulipta  Follow-up: ***  I spent a total of *** minutes on the date of the service. Headache education was done. Discussed lifestyle modification including increased oral hydration, decreased caffeine, exercise and stress management. Discussed treatment options including preventive and acute medications, natural supplements, and infusion therapy. Discussed medication overuse headache and to limit use of acute treatments to no more than 2 days/week or 10  days/month. Discussed medication side effects, adverse reactions and drug interactions. Written educational materials and patient instructions outlining all of the above were given.  Genia Harold, MD'

## 2022-09-09 ENCOUNTER — Ambulatory Visit: Payer: BC Managed Care – PPO | Admitting: Psychiatry

## 2022-09-24 ENCOUNTER — Ambulatory Visit: Payer: BC Managed Care – PPO | Admitting: Orthopaedic Surgery

## 2022-10-02 DIAGNOSIS — F411 Generalized anxiety disorder: Secondary | ICD-10-CM | POA: Diagnosis not present

## 2022-10-02 DIAGNOSIS — G4700 Insomnia, unspecified: Secondary | ICD-10-CM | POA: Diagnosis not present

## 2022-10-02 DIAGNOSIS — F909 Attention-deficit hyperactivity disorder, unspecified type: Secondary | ICD-10-CM | POA: Diagnosis not present

## 2022-10-02 DIAGNOSIS — F331 Major depressive disorder, recurrent, moderate: Secondary | ICD-10-CM | POA: Diagnosis not present

## 2022-11-05 DIAGNOSIS — F331 Major depressive disorder, recurrent, moderate: Secondary | ICD-10-CM | POA: Diagnosis not present

## 2022-11-05 DIAGNOSIS — G4700 Insomnia, unspecified: Secondary | ICD-10-CM | POA: Diagnosis not present

## 2022-11-05 DIAGNOSIS — F411 Generalized anxiety disorder: Secondary | ICD-10-CM | POA: Diagnosis not present

## 2022-11-05 DIAGNOSIS — F909 Attention-deficit hyperactivity disorder, unspecified type: Secondary | ICD-10-CM | POA: Diagnosis not present

## 2022-11-19 NOTE — Progress Notes (Unsigned)
   CC:  headaches  Follow-up Visit  Last visit: 04/01/22  Brief HPI: 56 year old female with a history of anxiety and HTN who follows in clinic for migraines.  At her last visit, brain MRI was ordered. She was started on Vyepti for prevention and Maxalt for rescue. Interval History: Headaches***Vyepti***Maxalt***  MRI was ordered but not performed.   Headache days per month: *** Migraine days per month*** Headache free days per month: ***  Current Headache Regimen: Preventative: *** Abortive: ***   Prior Therapies                                 Preventive: Effexor 225 mg daily Emgality 120 mg monthly Ajovy - lack of efficacy Aimovig - lack of efficacy Lisinopril - side effects Topamax - side effects Gabapentin 100 mg TID Amlodipine 10 mg daily Botox - lack of efficacy  Rescue: Tylenol BC powder Robaxin 500 mg PRN    Physical Exam:   Vital Signs: There were no vitals taken for this visit. GENERAL:  well appearing, in no acute distress, alert  SKIN:  Color, texture, turgor normal. No rashes or lesions HEAD:  Normocephalic/atraumatic. RESP: normal respiratory effort MSK:  No gross joint deformities.   NEUROLOGICAL: Mental Status: Alert, oriented to person, place and time, Follows commands, and Speech fluent and appropriate. Cranial Nerves: PERRL, face symmetric, no dysarthria, hearing grossly intact Motor: moves all extremities equally Gait: normal-based.  IMPRESSION: ***  PLAN: ***   Follow-up: ***  I spent a total of *** minutes on the date of the service. Headache education was done. Discussed lifestyle modification including increased oral hydration, decreased caffeine, exercise and stress management. Discussed treatment options including preventive and acute medications, natural supplements, and infusion therapy. Discussed medication overuse headache and to limit use of acute treatments to no more than 2 days/week or 10 days/month. Discussed  medication side effects, adverse reactions and drug interactions. Written educational materials and patient instructions outlining all of the above were given.  Ocie Doyne, MD

## 2022-11-20 ENCOUNTER — Encounter: Payer: Self-pay | Admitting: Psychiatry

## 2022-11-20 ENCOUNTER — Telehealth: Payer: Self-pay | Admitting: Neurology

## 2022-11-20 ENCOUNTER — Ambulatory Visit: Payer: BC Managed Care – PPO | Admitting: Psychiatry

## 2022-11-20 VITALS — BP 98/66 | HR 74 | Ht 60.0 in | Wt 132.0 lb

## 2022-11-20 DIAGNOSIS — G43119 Migraine with aura, intractable, without status migrainosus: Secondary | ICD-10-CM | POA: Diagnosis not present

## 2022-11-20 DIAGNOSIS — R946 Abnormal results of thyroid function studies: Secondary | ICD-10-CM | POA: Diagnosis not present

## 2022-11-20 DIAGNOSIS — D519 Vitamin B12 deficiency anemia, unspecified: Secondary | ICD-10-CM | POA: Diagnosis not present

## 2022-11-20 DIAGNOSIS — R799 Abnormal finding of blood chemistry, unspecified: Secondary | ICD-10-CM | POA: Diagnosis not present

## 2022-11-20 DIAGNOSIS — R202 Paresthesia of skin: Secondary | ICD-10-CM | POA: Diagnosis not present

## 2022-11-20 NOTE — Telephone Encounter (Signed)
Upon reaching out to infusion suite, they do not have a order. We will repeat a order and likely have to redo a PA through our infusion suite.  I have completed the order sheet and once MD signs will give orders to the infusion suite.

## 2022-11-21 LAB — TSH: TSH: 1.58 u[IU]/mL (ref 0.450–4.500)

## 2022-11-21 LAB — HEMOGLOBIN A1C
Est. average glucose Bld gHb Est-mCnc: 108 mg/dL
Hgb A1c MFr Bld: 5.4 % (ref 4.8–5.6)

## 2022-11-21 LAB — VITAMIN B12: Vitamin B-12: 1028 pg/mL (ref 232–1245)

## 2022-12-11 DIAGNOSIS — Z124 Encounter for screening for malignant neoplasm of cervix: Secondary | ICD-10-CM | POA: Diagnosis not present

## 2022-12-11 DIAGNOSIS — Z6825 Body mass index (BMI) 25.0-25.9, adult: Secondary | ICD-10-CM | POA: Diagnosis not present

## 2022-12-11 DIAGNOSIS — Z01419 Encounter for gynecological examination (general) (routine) without abnormal findings: Secondary | ICD-10-CM | POA: Diagnosis not present

## 2022-12-24 ENCOUNTER — Encounter: Payer: Self-pay | Admitting: Neurology

## 2022-12-24 MED ORDER — EMGALITY 120 MG/ML ~~LOC~~ SOAJ: 2.0000 | Freq: Once | SUBCUTANEOUS | 0 refills | Status: AC

## 2022-12-24 MED ORDER — EMGALITY 120 MG/ML ~~LOC~~ SOAJ: 120.0000 mg | SUBCUTANEOUS | 6 refills | Status: DC

## 2022-12-24 NOTE — Telephone Encounter (Signed)
We'll do Emgality for now. I sent a prescription to her pharmacy

## 2022-12-24 NOTE — Addendum Note (Signed)
Addended by: Genia Harold on: 12/24/2022 12:35 PM   Modules accepted: Orders

## 2022-12-24 NOTE — Telephone Encounter (Signed)
Vyepti is denied. Below is why it is denied

## 2022-12-24 NOTE — Telephone Encounter (Addendum)
Received denial via fax, given to [employer]

## 2022-12-30 ENCOUNTER — Telehealth: Payer: Self-pay | Admitting: Neurology

## 2022-12-30 NOTE — Telephone Encounter (Signed)
PA submitted for Emgality for the patient through CMM/[employer] [id number] Will await determination.

## 2023-01-07 DIAGNOSIS — N760 Acute vaginitis: Secondary | ICD-10-CM | POA: Diagnosis not present

## 2023-01-07 DIAGNOSIS — N950 Postmenopausal bleeding: Secondary | ICD-10-CM | POA: Diagnosis not present

## 2023-01-07 NOTE — Telephone Encounter (Signed)
Continuation form received, completed and placed on MD desk for signature.

## 2023-01-08 DIAGNOSIS — Z85828 Personal history of other malignant neoplasm of skin: Secondary | ICD-10-CM | POA: Diagnosis not present

## 2023-01-08 DIAGNOSIS — L821 Other seborrheic keratosis: Secondary | ICD-10-CM | POA: Diagnosis not present

## 2023-01-08 DIAGNOSIS — C44712 Basal cell carcinoma of skin of right lower limb, including hip: Secondary | ICD-10-CM | POA: Diagnosis not present

## 2023-01-12 NOTE — Telephone Encounter (Signed)
Patient was approve for her 120MG /ML [place] SOAJ. Auth good from 12/30/22 through 12/29/2023.

## 2023-01-13 DIAGNOSIS — F3342 Major depressive disorder, recurrent, in full remission: Secondary | ICD-10-CM | POA: Diagnosis not present

## 2023-01-13 DIAGNOSIS — F902 Attention-deficit hyperactivity disorder, combined type: Secondary | ICD-10-CM | POA: Diagnosis not present

## 2023-01-13 DIAGNOSIS — F5105 Insomnia due to other mental disorder: Secondary | ICD-10-CM | POA: Diagnosis not present

## 2023-01-13 DIAGNOSIS — F411 Generalized anxiety disorder: Secondary | ICD-10-CM | POA: Diagnosis not present

## 2023-01-14 DIAGNOSIS — Z780 Asymptomatic menopausal state: Secondary | ICD-10-CM | POA: Diagnosis not present

## 2023-01-27 DIAGNOSIS — F902 Attention-deficit hyperactivity disorder, combined type: Secondary | ICD-10-CM | POA: Diagnosis not present

## 2023-02-10 DIAGNOSIS — F902 Attention-deficit hyperactivity disorder, combined type: Secondary | ICD-10-CM | POA: Diagnosis not present

## 2023-02-10 DIAGNOSIS — F411 Generalized anxiety disorder: Secondary | ICD-10-CM | POA: Diagnosis not present

## 2023-02-10 DIAGNOSIS — F5105 Insomnia due to other mental disorder: Secondary | ICD-10-CM | POA: Diagnosis not present

## 2023-02-10 DIAGNOSIS — F3342 Major depressive disorder, recurrent, in full remission: Secondary | ICD-10-CM | POA: Diagnosis not present

## 2023-04-02 DIAGNOSIS — Z30433 Encounter for removal and reinsertion of intrauterine contraceptive device: Secondary | ICD-10-CM | POA: Diagnosis not present

## 2023-04-02 DIAGNOSIS — N950 Postmenopausal bleeding: Secondary | ICD-10-CM | POA: Diagnosis not present

## 2023-04-02 DIAGNOSIS — D259 Leiomyoma of uterus, unspecified: Secondary | ICD-10-CM | POA: Diagnosis not present

## 2023-06-01 DIAGNOSIS — F411 Generalized anxiety disorder: Secondary | ICD-10-CM | POA: Diagnosis not present

## 2023-06-01 DIAGNOSIS — F3342 Major depressive disorder, recurrent, in full remission: Secondary | ICD-10-CM | POA: Diagnosis not present

## 2023-06-01 DIAGNOSIS — F5105 Insomnia due to other mental disorder: Secondary | ICD-10-CM | POA: Diagnosis not present

## 2023-06-01 DIAGNOSIS — F902 Attention-deficit hyperactivity disorder, combined type: Secondary | ICD-10-CM | POA: Diagnosis not present

## 2023-06-08 DIAGNOSIS — F3342 Major depressive disorder, recurrent, in full remission: Secondary | ICD-10-CM | POA: Diagnosis not present

## 2023-06-16 DIAGNOSIS — F3342 Major depressive disorder, recurrent, in full remission: Secondary | ICD-10-CM | POA: Diagnosis not present

## 2023-06-16 DIAGNOSIS — F902 Attention-deficit hyperactivity disorder, combined type: Secondary | ICD-10-CM | POA: Diagnosis not present

## 2023-06-16 DIAGNOSIS — F411 Generalized anxiety disorder: Secondary | ICD-10-CM | POA: Diagnosis not present

## 2023-06-16 DIAGNOSIS — F5105 Insomnia due to other mental disorder: Secondary | ICD-10-CM | POA: Diagnosis not present

## 2023-06-23 DIAGNOSIS — Z1231 Encounter for screening mammogram for malignant neoplasm of breast: Secondary | ICD-10-CM | POA: Diagnosis not present

## 2023-06-23 DIAGNOSIS — R300 Dysuria: Secondary | ICD-10-CM | POA: Diagnosis not present

## 2023-07-15 DIAGNOSIS — F5105 Insomnia due to other mental disorder: Secondary | ICD-10-CM | POA: Diagnosis not present

## 2023-07-15 DIAGNOSIS — F902 Attention-deficit hyperactivity disorder, combined type: Secondary | ICD-10-CM | POA: Diagnosis not present

## 2023-07-15 DIAGNOSIS — F3342 Major depressive disorder, recurrent, in full remission: Secondary | ICD-10-CM | POA: Diagnosis not present

## 2023-07-15 DIAGNOSIS — F411 Generalized anxiety disorder: Secondary | ICD-10-CM | POA: Diagnosis not present

## 2023-08-08 DIAGNOSIS — M9901 Segmental and somatic dysfunction of cervical region: Secondary | ICD-10-CM | POA: Diagnosis not present

## 2023-08-08 DIAGNOSIS — M9902 Segmental and somatic dysfunction of thoracic region: Secondary | ICD-10-CM | POA: Diagnosis not present

## 2023-08-08 DIAGNOSIS — M9903 Segmental and somatic dysfunction of lumbar region: Secondary | ICD-10-CM | POA: Diagnosis not present

## 2023-08-08 DIAGNOSIS — M542 Cervicalgia: Secondary | ICD-10-CM | POA: Diagnosis not present

## 2023-09-02 DIAGNOSIS — M542 Cervicalgia: Secondary | ICD-10-CM | POA: Diagnosis not present

## 2023-09-02 DIAGNOSIS — M9902 Segmental and somatic dysfunction of thoracic region: Secondary | ICD-10-CM | POA: Diagnosis not present

## 2023-09-02 DIAGNOSIS — M9901 Segmental and somatic dysfunction of cervical region: Secondary | ICD-10-CM | POA: Diagnosis not present

## 2023-09-02 DIAGNOSIS — M9907 Segmental and somatic dysfunction of upper extremity: Secondary | ICD-10-CM | POA: Diagnosis not present

## 2023-09-02 DIAGNOSIS — M9903 Segmental and somatic dysfunction of lumbar region: Secondary | ICD-10-CM | POA: Diagnosis not present

## 2023-09-08 DIAGNOSIS — M542 Cervicalgia: Secondary | ICD-10-CM | POA: Diagnosis not present

## 2023-09-15 DIAGNOSIS — B9689 Other specified bacterial agents as the cause of diseases classified elsewhere: Secondary | ICD-10-CM | POA: Diagnosis not present

## 2023-09-15 DIAGNOSIS — J019 Acute sinusitis, unspecified: Secondary | ICD-10-CM | POA: Diagnosis not present

## 2023-10-06 ENCOUNTER — Other Ambulatory Visit (INDEPENDENT_AMBULATORY_CARE_PROVIDER_SITE_OTHER): Payer: BC Managed Care – PPO

## 2023-10-06 ENCOUNTER — Encounter: Payer: Self-pay | Admitting: Orthopaedic Surgery

## 2023-10-06 ENCOUNTER — Other Ambulatory Visit (INDEPENDENT_AMBULATORY_CARE_PROVIDER_SITE_OTHER): Payer: Self-pay

## 2023-10-06 ENCOUNTER — Ambulatory Visit: Payer: BC Managed Care – PPO | Admitting: Orthopaedic Surgery

## 2023-10-06 DIAGNOSIS — M542 Cervicalgia: Secondary | ICD-10-CM

## 2023-10-06 DIAGNOSIS — M545 Low back pain, unspecified: Secondary | ICD-10-CM | POA: Diagnosis not present

## 2023-10-06 DIAGNOSIS — G8929 Other chronic pain: Secondary | ICD-10-CM

## 2023-10-06 NOTE — Progress Notes (Signed)
Office Visit Note   Patient: Sheila Salazar           Date of Birth: 01-08-1966           MRN: [id number] Visit Date: 10/06/2023              Requested by: Ollen Bowl, MD [address] [place],  [place] [address] PCP: Ollen Bowl, MD   Assessment & Plan: Visit Diagnoses:  1. Chronic bilateral low back pain without sciatica   2. Neck pain     Plan: Sheila Salazar is a 57 year old female with cervical and lumbar spondylosis.  Not reporting any radicular symptoms.  Not able to feel any masses in her neck.  She has been to a chiropractor and has not seen any improvement.  I recommend physical therapy and over-the-counter medications.  Follow-up as needed.  Follow-Up Instructions: No follow-ups on file.   Orders:  Orders Placed This Encounter  Procedures   XR Cervical Spine 2 or 3 views   XR Lumbar Spine 2-3 Views   Ambulatory referral to Physical Therapy   No orders of the defined types were placed in this encounter.     Procedures: No procedures performed   Clinical Data: No additional findings.   Subjective: Chief Complaint  Patient presents with   Neck - Pain   Lower Back - Pain    HPI Sheila Salazar comes in today for evaluation of painful right-sided neck mass for 3 months.  She has tried heat ice and chiropractor manipulations.  She is also having low back pain for 2 months without any radicular symptoms.  Denies any numbness and tingling or injuries.  Review of Systems  Constitutional: Negative.   HENT: Negative.    Eyes: Negative.   Respiratory: Negative.    Cardiovascular: Negative.   Endocrine: Negative.   Musculoskeletal: Negative.   Neurological: Negative.   Hematological: Negative.   Psychiatric/Behavioral: Negative.    All other systems reviewed and are negative.    Objective: Vital Signs: There were no vitals taken for this visit.  Physical Exam Vitals and nursing note reviewed.  Constitutional:      Appearance: She is  well-developed.  HENT:     Head: Atraumatic.     Nose: Nose normal.  Eyes:     Extraocular Movements: Extraocular movements intact.  Cardiovascular:     Pulses: Normal pulses.  Pulmonary:     Effort: Pulmonary effort is normal.  Abdominal:     Palpations: Abdomen is soft.  Musculoskeletal:     Cervical back: Neck supple.  Skin:    General: Skin is warm.     Capillary Refill: Capillary refill takes less than 2 seconds.  Neurological:     Mental Status: She is alert. Mental status is at baseline.  Psychiatric:        Behavior: Behavior normal.        Thought Content: Thought content normal.        Judgment: Judgment normal.     Ortho Exam Exam of the cervical and lumbar spine are nonfocal.  Normal motor or sensory function in the upper and lower extremities.  Normal reflexes.  I am not able to feel any masses in the neck. Specialty Comments:  No specialty comments available.  Imaging: XR Lumbar Spine 2-3 Views  Result Date: 10/06/2023 Degenerative disease at L5-S1.  Lumbar spondylosis at L4-5 and L5-S1.  Preservation of lumbar lordosis.  XR Cervical Spine 2 or 3 views  Result  Date: 10/06/2023 Straightening of the cervical spine.  Moderate facet disease.  No acute or structural abnormalities.    PMFS History: Patient Active Problem List   Diagnosis Date Noted   Chronic rhinitis 03/11/2022   Mold exposure 03/11/2022   Other adverse food reactions, not elsewhere classified, subsequent encounter 03/11/2022   Primary osteoarthritis of left hip 09/16/2021   Status post total replacement of left hip 09/16/2021   Nonallopathic lesion of sacral region 07/27/2018   Nonallopathic lesion of rib cage 07/27/2018   Patellofemoral syndrome of both knees 02/10/2017   Right lumbar radiculopathy 03/06/2015   SI (sacroiliac) joint dysfunction 08/25/2014   Abdominal pain, chronic, right lower quadrant 04/04/2014   Hip flexor tightness 02/22/2014   Neck muscle spasm 01/31/2014    Nonallopathic lesion of cervical region 01/31/2014   Nonallopathic lesion of thoracic region 01/31/2014   Nonallopathic lesion of lumbosacral region 01/31/2014   Past Medical History:  Diagnosis Date   ADHD (attention deficit hyperactivity disorder)    Allergy    Anxiety    Depression    Headache    Hypertension    Insomnia    Migraine    Recurrent upper respiratory infection (URI)     Family History  Problem Relation Age of Onset   Breast cancer Mother    ALS Mother    Hypertension Father    Hypertension Brother     Past Surgical History:  Procedure Laterality Date   EYE SURGERY     TOTAL HIP ARTHROPLASTY Left 09/16/2021   Procedure: LEFT TOTAL HIP ARTHROPLASTY ANTERIOR APPROACH;  Surgeon: Tarry Kos, MD;  Location: [facility] OR;  Service: Orthopedics;  Laterality: Left;   Social History   Occupational History    Comment: real estate  Tobacco Use   Smoking status: Never   Smokeless tobacco: Never  Vaping Use   Vaping status: Never Used  Substance and Sexual Activity   Alcohol use: Yes    Comment: occasionally   Drug use: Never   Sexual activity: Not on file

## 2023-10-10 ENCOUNTER — Encounter (HOSPITAL_COMMUNITY): Payer: Self-pay

## 2023-10-10 ENCOUNTER — Ambulatory Visit (HOSPITAL_COMMUNITY)
Admission: EM | Admit: 2023-10-10 | Discharge: 2023-10-10 | Disposition: A | Discharge status: Home / Self Care | Payer: BC Managed Care – PPO | Attending: Family Medicine | Admitting: Family Medicine

## 2023-10-10 DIAGNOSIS — J310 Chronic rhinitis: Secondary | ICD-10-CM

## 2023-10-10 MED ORDER — PREDNISONE 20 MG PO TABS
40.0000 mg | ORAL_TABLET | Freq: Every day | ORAL | 0 refills | Status: DC
Start: 2023-10-10 — End: 2024-03-23

## 2023-10-10 MED ORDER — CEFDINIR 300 MG PO CAPS: 300.0000 mg | ORAL_CAPSULE | Freq: Two times a day (BID) | ORAL | 0 refills | Status: DC

## 2023-10-10 NOTE — ED Triage Notes (Addendum)
Patient here today with c/o nasal congestion and runny nose X 2 months. She has tried flonase, claritin, Financial controller, alfer spray, and astepro with no relief. She was also on an antibiotic for this but did not help.

## 2023-10-13 DIAGNOSIS — H26491 Other secondary cataract, right eye: Secondary | ICD-10-CM | POA: Diagnosis not present

## 2023-10-14 NOTE — ED Provider Notes (Signed)
[facility]   [id number] 10/10/23 Arrival Time: 1417  ASSESSMENT & PLAN:  1. Chronic rhinitis    Ques vasomotor. Discussed. But trial of: Meds ordered this encounter  Medications   cefdinir (OMNICEF) 300 MG capsule    Sig: Take 1 capsule (300 mg total) by mouth 2 (two) times daily.    Dispense:  20 capsule    Refill:  0   predniSONE (DELTASONE) 20 MG tablet    Sig: Take 2 tablets (40 mg total) by mouth daily.    Dispense:  10 tablet    Refill:  0    Discussed typical duration of symptoms. OTC symptom care as needed. Ensure adequate fluid intake and rest.   Follow-up Information     Schedule an appointment as soon as possible for a visit  with Ollen Bowl, MD.   Specialty: Internal Medicine Contact information: [address] [place] [address] [phone]                 Reviewed expectations re: course of current medical issues. Questions answered. Outlined signs and symptoms indicating need for more acute intervention. Patient verbalized understanding. After Visit Summary given.   SUBJECTIVE: History from: patient.  Sheila Salazar is a 57 y.o. female who presents with complaint of nasal congestion, post-nasal drainage, and sinus pain. Onset gradual,  over past 2 months . Respiratory symptoms: none. Fever: absent. Overall normal PO intake without n/v. OTC treatment: anti-histamine without much relief. Seasonal allergies: no. History of frequent sinus infections: no. No specific aggravating or alleviating factors reported. Social History   Tobacco Use  Smoking Status Never  Smokeless Tobacco Never    ROS: As per HPI.  OBJECTIVE:  Vitals:   10/10/23 1532 10/10/23 1533  BP:  130/69  Pulse:  85  Resp:  16  Temp:  98 F (36.7 C)  TempSrc:  Oral  SpO2:  94%  Weight: 54.4 kg   Height: 5' (1.524 m)      General appearance: alert; no distress HEENT: nasal congestion; clear runny nose; throat irritation  secondary to post-nasal drainage; bilateral maxillary tenderness to palpation; turbinates boggy Neck: supple without LAD; trachea midline Lungs: unlabored respirations, symmetrical air entry; cough: absent; no respiratory distress Skin: warm and dry Psychological: alert and cooperative; normal mood and affect  Allergies  Allergen Reactions   Erythromycin     Stomach cramps    Lisinopril Other (See Comments)   Sudafed [Pseudoephedrine] Other (See Comments)   Topiramate Other (See Comments)   Sulfa Antibiotics Rash    Past Medical History:  Diagnosis Date   ADHD (attention deficit hyperactivity disorder)    Allergy    Anxiety    Depression    Headache    Hypertension    Insomnia    Migraine    Recurrent upper respiratory infection (URI)    Family History  Problem Relation Age of Onset   Breast cancer Mother    ALS Mother    Hypertension Father    Hypertension Brother    Social History   Socioeconomic History   Marital status: Single    Spouse name: Not on file   Number of children: 2   Years of education: Not on file   Highest education level: Not on file  Occupational History    Comment: real estate  Tobacco Use   Smoking status: Never   Smokeless tobacco: Never  Vaping Use   Vaping status: Never Used  Substance and Sexual  Activity   Alcohol use: Yes    Comment: occasionally   Drug use: Never   Sexual activity: Not on file  Other Topics Concern   Not on file  Social History Narrative   Caffeine- 1 a day   Social Determinants of Health   Financial Resource Strain: Not on file  Food Insecurity: Not on file  Transportation Needs: Not on file  Physical Activity: Not on file  Stress: Not on file  Social Connections: Not on file  Intimate Partner Violence: Not on file             Mardella Layman, MD 10/14/23 [phone]

## 2023-10-21 DIAGNOSIS — R0982 Postnasal drip: Secondary | ICD-10-CM | POA: Diagnosis not present

## 2023-11-25 DIAGNOSIS — J342 Deviated nasal septum: Secondary | ICD-10-CM | POA: Diagnosis not present

## 2023-11-25 DIAGNOSIS — R0982 Postnasal drip: Secondary | ICD-10-CM | POA: Diagnosis not present

## 2023-11-25 DIAGNOSIS — J329 Chronic sinusitis, unspecified: Secondary | ICD-10-CM | POA: Diagnosis not present

## 2023-12-01 DIAGNOSIS — R531 Weakness: Secondary | ICD-10-CM | POA: Diagnosis not present

## 2023-12-01 DIAGNOSIS — R739 Hyperglycemia, unspecified: Secondary | ICD-10-CM | POA: Diagnosis not present

## 2023-12-01 DIAGNOSIS — Z23 Encounter for immunization: Secondary | ICD-10-CM | POA: Diagnosis not present

## 2023-12-01 DIAGNOSIS — R251 Tremor, unspecified: Secondary | ICD-10-CM | POA: Diagnosis not present

## 2024-01-01 DIAGNOSIS — Z01419 Encounter for gynecological examination (general) (routine) without abnormal findings: Secondary | ICD-10-CM | POA: Diagnosis not present

## 2024-01-01 DIAGNOSIS — Z124 Encounter for screening for malignant neoplasm of cervix: Secondary | ICD-10-CM | POA: Diagnosis not present

## 2024-01-01 DIAGNOSIS — Z6824 Body mass index (BMI) 24.0-24.9, adult: Secondary | ICD-10-CM | POA: Diagnosis not present

## 2024-01-07 DIAGNOSIS — Z713 Dietary counseling and surveillance: Secondary | ICD-10-CM | POA: Diagnosis not present

## 2024-01-21 DIAGNOSIS — Z713 Dietary counseling and surveillance: Secondary | ICD-10-CM | POA: Diagnosis not present

## 2024-01-21 DIAGNOSIS — Z833 Family history of diabetes mellitus: Secondary | ICD-10-CM | POA: Diagnosis not present

## 2024-01-26 DIAGNOSIS — R002 Palpitations: Secondary | ICD-10-CM | POA: Diagnosis not present

## 2024-01-26 DIAGNOSIS — R768 Other specified abnormal immunological findings in serum: Secondary | ICD-10-CM | POA: Diagnosis not present

## 2024-01-26 DIAGNOSIS — R7309 Other abnormal glucose: Secondary | ICD-10-CM | POA: Diagnosis not present

## 2024-01-26 DIAGNOSIS — K219 Gastro-esophageal reflux disease without esophagitis: Secondary | ICD-10-CM | POA: Diagnosis not present

## 2024-01-26 DIAGNOSIS — R42 Dizziness and giddiness: Secondary | ICD-10-CM | POA: Diagnosis not present

## 2024-01-26 DIAGNOSIS — R0989 Other specified symptoms and signs involving the circulatory and respiratory systems: Secondary | ICD-10-CM | POA: Diagnosis not present

## 2024-01-26 DIAGNOSIS — F902 Attention-deficit hyperactivity disorder, combined type: Secondary | ICD-10-CM | POA: Diagnosis not present

## 2024-01-26 DIAGNOSIS — R682 Dry mouth, unspecified: Secondary | ICD-10-CM | POA: Diagnosis not present

## 2024-01-26 DIAGNOSIS — Z8719 Personal history of other diseases of the digestive system: Secondary | ICD-10-CM | POA: Diagnosis not present

## 2024-02-04 DIAGNOSIS — F902 Attention-deficit hyperactivity disorder, combined type: Secondary | ICD-10-CM | POA: Diagnosis not present

## 2024-02-04 DIAGNOSIS — F3342 Major depressive disorder, recurrent, in full remission: Secondary | ICD-10-CM | POA: Diagnosis not present

## 2024-02-04 DIAGNOSIS — F5105 Insomnia due to other mental disorder: Secondary | ICD-10-CM | POA: Diagnosis not present

## 2024-02-04 DIAGNOSIS — F411 Generalized anxiety disorder: Secondary | ICD-10-CM | POA: Diagnosis not present

## 2024-03-11 ENCOUNTER — Telehealth: Payer: Self-pay

## 2024-03-11 DIAGNOSIS — I493 Ventricular premature depolarization: Secondary | ICD-10-CM | POA: Diagnosis not present

## 2024-03-11 DIAGNOSIS — T50905A Adverse effect of unspecified drugs, medicaments and biological substances, initial encounter: Secondary | ICD-10-CM | POA: Diagnosis not present

## 2024-03-11 NOTE — Telephone Encounter (Signed)
 Patient called and stated that April the 2 nd does work for her at 9:30. Patient stated that she would like to let Dr. Tomasa Rand know that she really appreciates it. Please advise.

## 2024-03-11 NOTE — Telephone Encounter (Signed)
 Left VM for patient informing her she has been scheduled for an appointment with Dr.Cunningham on 03/23/24 @ 9:30am

## 2024-03-12 DIAGNOSIS — I493 Ventricular premature depolarization: Secondary | ICD-10-CM | POA: Diagnosis not present

## 2024-03-14 DIAGNOSIS — F3342 Major depressive disorder, recurrent, in full remission: Secondary | ICD-10-CM | POA: Diagnosis not present

## 2024-03-14 DIAGNOSIS — F902 Attention-deficit hyperactivity disorder, combined type: Secondary | ICD-10-CM | POA: Diagnosis not present

## 2024-03-14 DIAGNOSIS — F411 Generalized anxiety disorder: Secondary | ICD-10-CM | POA: Diagnosis not present

## 2024-03-14 DIAGNOSIS — F5105 Insomnia due to other mental disorder: Secondary | ICD-10-CM | POA: Diagnosis not present

## 2024-03-16 DIAGNOSIS — R1011 Right upper quadrant pain: Secondary | ICD-10-CM | POA: Diagnosis not present

## 2024-03-16 DIAGNOSIS — R09A2 Foreign body sensation, throat: Secondary | ICD-10-CM | POA: Diagnosis not present

## 2024-03-16 DIAGNOSIS — H5789 Other specified disorders of eye and adnexa: Secondary | ICD-10-CM | POA: Diagnosis not present

## 2024-03-16 DIAGNOSIS — R682 Dry mouth, unspecified: Secondary | ICD-10-CM | POA: Diagnosis not present

## 2024-03-23 ENCOUNTER — Encounter: Payer: Self-pay | Admitting: Gastroenterology

## 2024-03-23 ENCOUNTER — Ambulatory Visit: Admitting: Gastroenterology

## 2024-03-23 VITALS — BP 130/68 | HR 87 | Ht 60.0 in | Wt 122.0 lb

## 2024-03-23 DIAGNOSIS — R140 Abdominal distension (gaseous): Secondary | ICD-10-CM

## 2024-03-23 DIAGNOSIS — R09A2 Foreign body sensation, throat: Secondary | ICD-10-CM | POA: Diagnosis not present

## 2024-03-23 DIAGNOSIS — R110 Nausea: Secondary | ICD-10-CM | POA: Diagnosis not present

## 2024-03-23 DIAGNOSIS — R1310 Dysphagia, unspecified: Secondary | ICD-10-CM | POA: Diagnosis not present

## 2024-03-23 DIAGNOSIS — K8020 Calculus of gallbladder without cholecystitis without obstruction: Secondary | ICD-10-CM

## 2024-03-23 DIAGNOSIS — R1319 Other dysphagia: Secondary | ICD-10-CM

## 2024-03-23 MED ORDER — AMITRIPTYLINE HCL 10 MG PO TABS: 10.0000 mg | ORAL_TABLET | Freq: Every day | ORAL | 1 refills | Status: AC

## 2024-03-23 NOTE — Patient Instructions (Addendum)
 We have sent the following medications to your pharmacy for you to pick up at your convenience: Elavil 10 mg take one tablet every night for 2 weeks then increase to two tablets.  You have been scheduled for an endoscopy. Please follow written instructions given to you at your visit today.  If you use inhalers (even only as needed), please bring them with you on the day of your procedure.  If you take any of the following medications, they will need to be adjusted prior to your procedure:   DO NOT TAKE 7 DAYS PRIOR TO TEST- Trulicity (dulaglutide) Ozempic, Wegovy (semaglutide) Mounjaro (tirzepatide) Bydureon Bcise (exanatide extended release)  DO NOT TAKE 1 DAY PRIOR TO YOUR TEST Rybelsus (semaglutide) Adlyxin (lixisenatide) Victoza (liraglutide) Byetta (exanatide) ___________________________________________________________________________   _______________________________________________________  If your blood pressure at your visit was 140/90 or greater, please contact your primary care physician to follow up on this.  _______________________________________________________  If you are age 75 or older, your body mass index should be between 23-30. Your Body mass index is 23.83 kg/m. If this is out of the aforementioned range listed, please consider follow up with your Primary Care Provider.  If you are age 58 or younger, your body mass index should be between 19-25. Your Body mass index is 23.83 kg/m. If this is out of the aformentioned range listed, please consider follow up with your Primary Care Provider.   ________________________________________________________  The [facility] providers would like to encourage you to use [facility] to communicate with providers for non-urgent requests or questions.  Due to long hold times on the telephone, sending your provider a message by [facility] may be a faster and more efficient way to get a response.  Please allow 48 business hours for a  response.  Please remember that this is for non-urgent requests.  _______________________________________________________   It was a pleasure to see you today!  Thank you for trusting me with your gastrointestinal care!

## 2024-03-23 NOTE — Progress Notes (Unsigned)
 HPI :    8 months ago Sinus infection Clearing sinuses Antibiotics, steroids Constant throat clearing; only better when it lays flat Swallows thick phlegm, but can't ever bring it up Nauseated all the time.  No vomiting Something in her throat all the time  Decreased appetite, night sweats, head itching, feels bloated  Hard to swallow; feels like food gets stuck, always goes down.  Able to sleep.  Rare typical GERD symptoms. Cough started 2 weeks ago. Symptoms don't vary with eating. Tightness  Omeprazole for 3 months, not helpful  ENT NP CT  Past Medical History:  Diagnosis Date   ADHD (attention deficit hyperactivity disorder)    Allergy    Anxiety    Cholelithiases    Depression    Gallstones    Headache    Hypertension    Insomnia    Migraine    Recurrent upper respiratory infection (URI)      Past Surgical History:  Procedure Laterality Date   EYE SURGERY     TOTAL HIP ARTHROPLASTY Left 09/16/2021   Procedure: LEFT TOTAL HIP ARTHROPLASTY ANTERIOR APPROACH;  Surgeon: Tarry Kos, MD;  Location: [facility] OR;  Service: Orthopedics;  Laterality: Left;   Family History  Problem Relation Age of Onset   Breast cancer Mother    ALS Mother    Hypertension Father    Hypertension Brother    Liver disease Neg Hx    Esophageal cancer Neg Hx    Colon cancer Neg Hx    Social History   Tobacco Use   Smoking status: Never   Smokeless tobacco: Never  Vaping Use   Vaping status: Never Used  Substance Use Topics   Alcohol use: Yes    Comment: occasionally   Drug use: Never   Current Outpatient Medications  Medication Sig Dispense Refill   estradiol (ESTRACE) 1 MG tablet Take 1 mg by mouth daily.     levonorgestrel (MIRENA, 52 MG,) 20 MCG/DAY IUD 1 each by Intrauterine route once.     omeprazole (PRILOSEC) 20 MG capsule Take 20 mg by mouth daily.     traZODone (DESYREL) 100 MG tablet Take 1 tablet by mouth daily.     Magnesium 400 MG TABS Take 400 mg by  mouth daily. (Patient not taking: Reported on 03/23/2024)     Multiple Vitamins-Minerals (ZINC PO) Take 60 mg by mouth daily. (Patient not taking: Reported on 03/23/2024)     rizatriptan (MAXALT) 10 MG tablet Take 1 tablet (10 mg total) by mouth as needed for migraine. May repeat in 2 hours if needed (Patient not taking: Reported on 03/23/2024) 10 tablet 3   No current facility-administered medications for this visit.   Allergies  Allergen Reactions   Erythromycin     Stomach cramps    Lisinopril Other (See Comments)   Sudafed [Pseudoephedrine] Other (See Comments)   Topiramate Other (See Comments)   Sulfa Antibiotics Rash     Review of Systems: All systems reviewed and negative except where noted in HPI.    No results found.  Physical Exam: BP 130/68   Pulse 87   Ht 5' (1.524 m)   Wt 122 lb (55.3 kg)   BMI 23.83 kg/m  Constitutional: Pleasant,well-developed, ***female in no acute distress. HEENT: Normocephalic and atraumatic. Conjunctivae are normal. No scleral icterus. Neck supple.  Cardiovascular: Normal rate, regular rhythm.  Pulmonary/chest: Effort normal and breath sounds normal. No wheezing, rales or rhonchi. Abdominal: Soft, nondistended, nontender. Bowel sounds active throughout. There  are no masses palpable. No hepatomegaly. Extremities: no edema Lymphadenopathy: No cervical adenopathy noted. Neurological: Alert and oriented to person place and time. Skin: Skin is warm and dry. No rashes noted. Psychiatric: Normal mood and affect. Behavior is normal.  CBC    Component Value Date/Time   WBC 11.0 (H) 09/17/2021 0706   RBC 3.46 (L) 09/17/2021 0706   HGB 10.0 (L) 09/17/2021 0706   HCT 30.8 (L) 09/17/2021 0706   PLT 351 09/17/2021 0706   MCV 89.0 09/17/2021 0706   MCH 28.9 09/17/2021 0706   MCHC 32.5 09/17/2021 0706   RDW 13.3 09/17/2021 0706   LYMPHSABS 1.0 09/12/2021 0829   MONOABS 0.5 09/12/2021 0829   EOSABS 0.1 09/12/2021 0829   BASOSABS 0.0 09/12/2021  0829    CMP     Component Value Date/Time   NA 136 09/17/2021 0706   K 4.1 09/17/2021 0706   CL 107 09/17/2021 0706   CO2 24 09/17/2021 0706   GLUCOSE 111 (H) 09/17/2021 0706   BUN 10 09/17/2021 0706   CREATININE 0.67 09/17/2021 0706   CALCIUM 8.5 (L) 09/17/2021 0706   PROT 6.9 09/12/2021 0829   ALBUMIN 3.9 09/12/2021 0829   AST 23 09/12/2021 0829   ALT 34 09/12/2021 0829   ALKPHOS 94 09/12/2021 0829   BILITOT 0.6 09/12/2021 0829   GFRNONAA >60 09/17/2021 0706   GFRAA >60 09/09/2011 1610       Latest Ref Rng & Units 09/17/2021    7:06 AM 09/12/2021    8:29 AM 02/03/2018    3:37 PM  CBC EXTENDED  WBC 4.0 - 10.5 K/uL 11.0  5.7  5.5   RBC 3.87 - 5.11 MIL/uL 3.46  4.97  4.48   Hemoglobin 12.0 - 15.0 g/dL 95.6  21.3  08.6   HCT 36.0 - 46.0 % 30.8  44.3  41.2   Platelets 150 - 400 K/uL 351  465  313.0   NEUT# 1.7 - 7.7 K/uL  3.9  3.5   Lymph# 0.7 - 4.0 K/uL  1.0  1.4       ASSESSMENT AND PLAN:  Sheila Bowl, MD

## 2024-03-24 ENCOUNTER — Encounter: Payer: Self-pay | Admitting: Gastroenterology

## 2024-03-24 ENCOUNTER — Ambulatory Visit: Admitting: Gastroenterology

## 2024-03-24 VITALS — BP 138/79 | HR 75 | Temp 98.1°F | Resp 15 | Ht 60.0 in | Wt 122.0 lb

## 2024-03-24 DIAGNOSIS — K319 Disease of stomach and duodenum, unspecified: Secondary | ICD-10-CM | POA: Diagnosis not present

## 2024-03-24 DIAGNOSIS — K2289 Other specified disease of esophagus: Secondary | ICD-10-CM

## 2024-03-24 DIAGNOSIS — R110 Nausea: Secondary | ICD-10-CM

## 2024-03-24 DIAGNOSIS — K3189 Other diseases of stomach and duodenum: Secondary | ICD-10-CM

## 2024-03-24 DIAGNOSIS — R09A2 Foreign body sensation, throat: Secondary | ICD-10-CM

## 2024-03-24 MED ORDER — SODIUM CHLORIDE 0.9 % IV SOLN: 500.0000 mL | INTRAVENOUS | Status: DC

## 2024-03-24 MED ORDER — PANTOPRAZOLE SODIUM 40 MG PO TBEC
40.0000 mg | DELAYED_RELEASE_TABLET | Freq: Two times a day (BID) | ORAL | 0 refills | Status: DC
Start: 2024-03-24 — End: 2024-04-13

## 2024-03-24 NOTE — Progress Notes (Signed)
 Vss nad trans to pacu

## 2024-03-24 NOTE — Progress Notes (Signed)
 Called to room to assist during endoscopic procedure.  Patient ID and intended procedure confirmed with present staff. Received instructions for my participation in the procedure from the performing physician.

## 2024-03-24 NOTE — Progress Notes (Signed)
 History and Physical Interval Note:  03/24/2024 3:21 PM  Sheila Salazar  has presented today for endoscopic procedure(s), with the diagnosis of  Encounter Diagnoses  Name Primary?   Globus sensation Yes   Nausea without vomiting   .  The various methods of evaluation and treatment have been discussed with the patient and/or family. After consideration of risks, benefits and other options for treatment, the patient has consented to  the endoscopic procedure(s).   The patient's history has been reviewed, patient examined, no change in status, stable for endoscopic procedure(s).  I have reviewed the patient's chart and labs.  Questions were answered to the patient's satisfaction.     Kaelynne Christley E. Tomasa Rand, MD [facility]

## 2024-03-24 NOTE — Patient Instructions (Addendum)
 Resume previous diet Continue present medications, begin Pantoprazole (Protonix) 40 mg twice daily for 4 weeks Minimize use of  NSAIDS (Non-Steroidal anti-inflammatory drugs).  (These include, aspirin, aspirin-containing products(products containing salicylic acid like Pepto Bismol and Alka Seltzer), ibuprofen, advil, motrin, naproxen, aleve, goody powders, etc) Tylenol is ok to take as needed, see label for instructions. Await pathology results  Handouts/information given for Pantoprazole (protonix)  YOU HAD AN ENDOSCOPIC PROCEDURE TODAY AT THE [facility]:   Refer to the procedure report that was given to you for any specific questions about what was found during the examination.  If the procedure report does not answer your questions, please call your gastroenterologist to clarify.  If you requested that your care partner not be given the details of your procedure findings, then the procedure report has been included in a sealed envelope for you to review at your convenience later.  YOU SHOULD EXPECT: Some feelings of bloating in the abdomen. Passage of more gas than usual.  Walking can help get rid of the air that was put into your GI tract during the procedure and reduce the bloating. If you had a lower endoscopy (such as a colonoscopy or flexible sigmoidoscopy) you may notice spotting of blood in your stool or on the toilet paper. If you underwent a bowel prep for your procedure, you may not have a normal bowel movement for a few days.  Please Note:  You might notice some irritation and congestion in your nose or some drainage.  This is from the oxygen used during your procedure.  There is no need for concern and it should clear up in a day or so.  SYMPTOMS TO REPORT IMMEDIATELY:  Following upper endoscopy (EGD)  Vomiting of blood or coffee ground material  New chest pain or pain under the shoulder blades  Painful or persistently difficult swallowing  New shortness of  breath  Fever of 100F or higher  Black, tarry-looking stools For urgent or emergent issues, a gastroenterologist can be reached at any hour by calling [phone]. Do not use MyChart messaging for urgent concerns.   DIET:  We do recommend a small meal at first, but then you may proceed to your regular diet.  Drink plenty of fluids but you should avoid alcoholic beverages for 24 hours.  ACTIVITY:  You should plan to take it easy for the rest of today and you should NOT DRIVE or use heavy machinery until tomorrow (because of the sedation medicines used during the test).    FOLLOW UP: Our staff will call the number listed on your records the next business day following your procedure.  We will call around 7:15- 8:00 am to check on you and address any questions or concerns that you may have regarding the information given to you following your procedure. If we do not reach you, we will leave a message.     If any biopsies were taken you will be contacted by phone or by letter within the next 1-3 weeks.  Please call us at [phone] if you have not heard about the biopsies in 3 weeks.   SIGNATURES/CONFIDENTIALITY: You and/or your care partner have signed paperwork which will be entered into your electronic medical record.  These signatures attest to the fact that that the information above on your After Visit Summary has been reviewed and is understood.  Full responsibility of the confidentiality of this discharge information lies with you and/or your care-partner.

## 2024-03-24 NOTE — Op Note (Signed)
 [facility] Patient Name: Sheila Salazar Procedure Date: 03/24/2024 3:18 PM MRN: [id number] Endoscopist: Lorin Picket E. Tomasa Rand , MD, [id number] Age: 58 Referring MD:  Date of Birth: 1966-02-17 Gender: Female Account #: [id number] Procedure:                Upper GI endoscopy Indications:              Abdominal bloating, Globus sensation, Nausea Medicines:                Monitored Anesthesia Care Procedure:                Pre-Anesthesia Assessment:                           - Prior to the procedure, a History and Physical                            was performed, and patient medications and                            allergies were reviewed. The patient's tolerance of                            previous anesthesia was also reviewed. The risks                            and benefits of the procedure and the sedation                            options and risks were discussed with the patient.                            All questions were answered, and informed consent                            was obtained. Prior Anticoagulants: The patient has                            taken no anticoagulant or antiplatelet agents. ASA                            Grade Assessment: II - A patient with mild systemic                            disease. After reviewing the risks and benefits,                            the patient was deemed in satisfactory condition to                            undergo the procedure.                           After obtaining informed consent, the endoscope was  passed under direct vision. Throughout the                            procedure, the patient's blood pressure, pulse, and                            oxygen saturations were monitored continuously. The                            Olympus Scope SN [id number] was introduced through the                            mouth, and advanced to the second part of duodenum.                             The upper GI endoscopy was accomplished without                            difficulty. The patient tolerated the procedure                            well. Scope In: Scope Out: Findings:                 The examined portions of the nasopharynx,                            oropharynx and larynx were normal.                           A single area of ectopic gastric mucosa was found                            in the upper third of the esophagus. Biopsies were                            taken with a cold forceps for histology. Estimated                            blood loss was minimal.                           The exam of the esophagus was otherwise normal.                           Multiple dispersed diminutive erosions with no                            bleeding and no stigmata of recent bleeding were                            found in the prepyloric region of the stomach.                            Biopsies  were taken with a cold forceps for                            Helicobacter pylori testing. Estimated blood loss                            was minimal.                           The exam of the stomach was otherwise normal.                           The examined duodenum was normal. Complications:            No immediate complications. Estimated Blood Loss:     Estimated blood loss was minimal. Impression:               - The examined portions of the nasopharynx,                            oropharynx and larynx were normal.                           - Ectopic gastric mucosa in the upper third of the                            esophagus. Biopsied. Doubt this is functional                            gastric mucosa, but theoretically possible that                            this could be contributing to globus sensation.                           - Erosive gastropathy with no bleeding and no                            stigmata of recent bleeding. Biopsied.                           -  Normal examined duodenum. Recommendation:           - Patient has a contact number available for                            emergencies. The signs and symptoms of potential                            delayed complications were discussed with the                            patient. Return to normal activities tomorrow.                            Written discharge instructions were provided to  the                            patient.                           - Resume previous diet.                           - Use Protonix (pantoprazole) 40 mg PO BID for 4                            weeks to heal erosions and to see if throat                            irritation improves.                           - Avoid NSAIDs                           - Can consider referral for ablation of gastric                            inlet patch if symptoms not improving and no other                            etiologies identified. Monisha Siebel E. Tomasa Rand, MD 03/24/2024 3:47:31 PM This report has been signed electronically.

## 2024-03-25 ENCOUNTER — Telehealth: Payer: Self-pay

## 2024-03-25 NOTE — Telephone Encounter (Signed)
 Left message on answering machine.

## 2024-03-29 LAB — SURGICAL PATHOLOGY

## 2024-04-04 ENCOUNTER — Encounter: Payer: Self-pay | Admitting: Gastroenterology

## 2024-04-04 NOTE — Progress Notes (Signed)
 Sheila Salazar,  The biopsies taken from your stomach were notable for mild reactive gastropathy which is a common finding and often related to use of certain medications (usually NSAIDs), but there was no evidence of Helicobacter pylori infection. This common finding is not felt to necessarily be a cause of any particular symptom and there is no specific treatment or further evaluation recommended.  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  As discussed, these are generally thought to be asymptomatic, and it has been present your whole life. I have messaged the pathologist to see if there are acid producing cells on the biopsies and am awaiting his reply. If there are acid producing cells, it would be more suggestive that this may be contributing to your symptoms of throat irritation.  For now, I recommend you continue the Protonix twice daily and the Elavil at night.

## 2024-04-13 ENCOUNTER — Other Ambulatory Visit: Payer: Self-pay | Admitting: Gastroenterology

## 2024-04-14 NOTE — Progress Notes (Signed)
 Sheila Salazar,  I was able to speak with the pathologist further about the biopsies of your gastric inlet patch.  There was a paucity of acid producing cells noted in the sample (5-10%).  I think it is quite unlikely that the inlet patch is responsible for your symptoms, and I think ablating it would pose more risk than benefit.  I think continuing the Elavil  would be my recommendation.  If you are not having significant side effects, you can increase the dose to 20 mg at night.   I see that Dr. Rozanne Corners referred you to Speech Pathology as well.  Hopefully your symptoms will improve soon.

## 2024-06-28 ENCOUNTER — Other Ambulatory Visit: Payer: Self-pay | Admitting: Obstetrics and Gynecology

## 2024-06-28 DIAGNOSIS — R928 Other abnormal and inconclusive findings on diagnostic imaging of breast: Secondary | ICD-10-CM

## 2024-06-29 ENCOUNTER — Ambulatory Visit
Admission: RE | Admit: 2024-06-29 | Discharge: 2024-06-29 | Disposition: A | Discharge status: Home / Self Care | Source: Ambulatory Visit | Attending: Obstetrics and Gynecology | Admitting: Obstetrics and Gynecology

## 2024-06-29 ENCOUNTER — Inpatient Hospital Stay: Admission: RE | Admit: 2024-06-29 | Source: Ambulatory Visit

## 2024-06-29 DIAGNOSIS — R928 Other abnormal and inconclusive findings on diagnostic imaging of breast: Secondary | ICD-10-CM

## 2024-10-03 ENCOUNTER — Ambulatory Visit: Admitting: Cardiovascular Disease

## 2024-10-27 ENCOUNTER — Other Ambulatory Visit (HOSPITAL_COMMUNITY): Payer: Self-pay | Admitting: Internal Medicine

## 2024-10-27 DIAGNOSIS — R4781 Slurred speech: Secondary | ICD-10-CM

## 2024-11-03 ENCOUNTER — Ambulatory Visit (HOSPITAL_COMMUNITY)
Admission: RE | Admit: 2024-11-03 | Discharge: 2024-11-03 | Disposition: A | Discharge status: Home / Self Care | Payer: Self-pay | Source: Ambulatory Visit | Attending: Internal Medicine | Admitting: Internal Medicine

## 2024-11-03 DIAGNOSIS — R4781 Slurred speech: Secondary | ICD-10-CM | POA: Diagnosis present

## 2024-11-03 MED ORDER — GADOBUTROL 1 MMOL/ML IV SOLN
6.0000 mL | Freq: Once | INTRAVENOUS | Status: AC | PRN
  Administered 2024-11-03: 6 mL via INTRAVENOUS

## 2025-02-07 ENCOUNTER — Institutional Professional Consult (permissible substitution): Payer: Self-pay | Admitting: Neurology
